# Patient Record
Sex: Female | Born: 1960 | Race: White | Hispanic: No | Marital: Married
Health system: Southern US, Community
[De-identification: ages and names within clinical notes are randomized; demographics above are authoritative.]

---

## 2001-05-04 ENCOUNTER — Other Ambulatory Visit: Admission: RE | Admit: 2001-05-04 | Discharge: 2001-05-04 | Payer: Self-pay | Admitting: Obstetrics and Gynecology

## 2002-05-05 ENCOUNTER — Other Ambulatory Visit: Admission: RE | Admit: 2002-05-05 | Discharge: 2002-05-05 | Payer: Self-pay | Admitting: Obstetrics and Gynecology

## 2003-05-09 ENCOUNTER — Other Ambulatory Visit: Admission: RE | Admit: 2003-05-09 | Discharge: 2003-05-09 | Payer: Self-pay | Admitting: Obstetrics & Gynecology

## 2012-04-01 ENCOUNTER — Encounter (HOSPITAL_COMMUNITY): Payer: Self-pay | Admitting: *Deleted

## 2012-04-01 ENCOUNTER — Emergency Department (HOSPITAL_COMMUNITY)
Admission: EM | Admit: 2012-04-01 | Discharge: 2012-04-02 | Disposition: A | Discharge status: Home / Self Care | Payer: BC Managed Care – PPO | Attending: Emergency Medicine | Admitting: Emergency Medicine

## 2012-04-01 ENCOUNTER — Emergency Department (HOSPITAL_COMMUNITY): Payer: BC Managed Care – PPO

## 2012-04-01 DIAGNOSIS — M542 Cervicalgia: Secondary | ICD-10-CM | POA: Insufficient documentation

## 2012-04-01 DIAGNOSIS — R42 Dizziness and giddiness: Secondary | ICD-10-CM | POA: Insufficient documentation

## 2012-04-01 DIAGNOSIS — R110 Nausea: Secondary | ICD-10-CM | POA: Insufficient documentation

## 2012-04-01 DIAGNOSIS — M546 Pain in thoracic spine: Secondary | ICD-10-CM | POA: Insufficient documentation

## 2012-04-01 LAB — GLUCOSE, CAPILLARY: Glucose-Capillary: 94 mg/dL (ref 70–99)

## 2012-04-01 LAB — URINALYSIS, DIPSTICK ONLY
Glucose, UA: NEGATIVE mg/dL
Hgb urine dipstick: NEGATIVE
Leukocytes, UA: NEGATIVE
Protein, ur: NEGATIVE mg/dL
pH: 8.5 — ABNORMAL HIGH (ref 5.0–8.0)

## 2012-04-01 LAB — CBC
Hemoglobin: 13.8 g/dL (ref 12.0–15.0)
Platelets: 183 10*3/uL (ref 150–400)
RBC: 4.44 MIL/uL (ref 3.87–5.11)

## 2012-04-01 LAB — DIFFERENTIAL
Basophils Relative: 0 % (ref 0–1)
Eosinophils Absolute: 0 10*3/uL (ref 0.0–0.7)
Lymphs Abs: 0.7 10*3/uL (ref 0.7–4.0)
Monocytes Relative: 6 % (ref 3–12)
Neutro Abs: 5.8 10*3/uL (ref 1.7–7.7)
Neutrophils Relative %: 84 % — ABNORMAL HIGH (ref 43–77)

## 2012-04-01 LAB — BASIC METABOLIC PANEL
BUN: 17 mg/dL (ref 6–23)
Chloride: 101 mEq/L (ref 96–112)
GFR calc Af Amer: >90 mL/min (ref >90)
GFR calc non Af Amer: >90 mL/min (ref >90)
Glucose, Bld: 103 mg/dL — ABNORMAL HIGH (ref 70–99)
Potassium: 4.3 mEq/L (ref 3.5–5.1)
Sodium: 139 mEq/L (ref 135–145)

## 2012-04-01 LAB — POCT I-STAT TROPONIN I: Troponin i, poc: 0 ng/mL (ref 0.00–0.08)

## 2012-04-01 MED ORDER — ONDANSETRON HCL 4 MG/2ML IJ SOLN
INTRAMUSCULAR | Status: AC
  Administered 2012-04-01: 20:00:00
  Filled 2012-04-01: qty 2

## 2012-04-01 NOTE — ED Notes (Signed)
Pt arrives via ambulance from urgent care center with c/o nausea/dizziness and tingling to BUE. Pt denies chest pain/SOB.

## 2012-04-01 NOTE — Discharge Instructions (Signed)
You should see a doctor soon to followup on your symptoms.  See your doctor immediately--or return to the ED--with any new or troubling symptoms including fevers, weakness, new chest pain, shortness or breath, numbness, or any other concerning symptom.

## 2012-04-01 NOTE — ED Provider Notes (Signed)
History     CSN: [id number]  Arrival date & time 04/01/12  1931   First MD Initiated Contact with Patient 04/01/12 1947      Chief Complaint  Patient presents with  . Nausea  . Dizziness    (Consider location/radiation/quality/duration/timing/severity/associated sxs/prior treatment) HPI  51 year old female with no medical history presents today with a one-hour history of lightheadedness and feelings of arm heaviness bilaterally. She first noticed them while sitting. She had the sense that her arms are heavy. She denied any other symptoms including true weakness in numbness or dysesthesia, headache, fever, chills, chest pain, dyspnea, confusion, dysarthria. Her symptoms have moderated. She presented to urgent care clinic where she was sent here. She now endorses some mild nausea with no vomiting. She called her illness mild. Nothing makes better or worse. Vital signs were normal arrival.  History reviewed. No pertinent past medical history.  History reviewed. No pertinent past surgical history.  History reviewed. No pertinent family history.  History  Substance Use Topics  . Smoking status: Not on file  . Smokeless tobacco: Not on file  . Alcohol Use: No    OB History    Grav Para Term Preterm Abortions TAB SAB Ect Mult Living                  Review of Systems Constitutional: Negative for fever and chills.  HENT: Negative for ear pain, sore throat and trouble swallowing.   Eyes: Negative for pain and visual disturbance.  Respiratory: Negative for cough and shortness of breath.   Cardiovascular: Negative for chest pain and leg swelling.  Gastrointestinal: POS dysuria, urgency and frequency.  Musculoskeletal: Negative for back pain and joint swelling.  Skin: Negative for rash and wound.  Neurological: Negative for lightheadedness, neg syncope, speech difficulty, weakness and numbness.   Allergies  Review of patient's allergies indicates no known allergies.  Home  Medications  No current outpatient prescriptions on file.  BP 130/79  Pulse 79  Temp 97.8 F (36.6 C) (Oral)  Resp 18  SpO2 99%  LMP 10/19/2008  Physical Exam Consitutional: Pt in no acute distress.   Head: Normocephalic and atraumatic.  Eyes: Extraocular motion intact, no scleral icterus Neck: Supple without meningismus, mass, or overt JVD Respiratory: Effort normal and breath sounds normal. No respiratory distress. CV: Heart regular rate and regular rhythm (sinus), no obvious murmurs.  Pulses +2 and symmetric Abdomen: Soft, non-tender, non-distended. No rebound or guarding.  MSK: Extremities are atraumatic without deformity, ROM intact Skin: Warm, dry, intact Neuro: Alert and oriented, no motor deficit noted.  PERRL.  EOMI.  Symmetric arm raises bilaterally, no pronator drift. Reflexes normal R at achilles and patella;  no clonus.  Hyperreflexive left leg at patella with 3 or 4 repeated reflexes.  Hips, knee and ankle, arm and wrist strength maintained >=4/5.  FTN and RAM normal.  CNs 2-12 normal.   Babinski normal.  Spurling negative.   Psychiatric: Mood and affect are normal  EKG:  Rate: 87  Rythym Sinus t Interval  124 ms. Axis: normal No gross conduction abnormalities appreciated.  No gross ST or T-wave abnormalities appreciated.  S wave inversions V1-V6, poor R wave progression.    ED Course  Procedures (including critical care time)  Labs Reviewed  DIFFERENTIAL - Abnormal; Notable for the following:    Neutrophils Relative 84 (*)     Lymphocytes Relative 10 (*)     All other components within normal limits  BASIC METABOLIC PANEL -  Abnormal; Notable for the following:    Glucose, Bld 103 (*)     All other components within normal limits  URINALYSIS, DIPSTICK ONLY - Abnormal; Notable for the following:    pH 8.5 (*)     Ketones, ur 15 (*)     All other components within normal limits  GLUCOSE, CAPILLARY  CBC  POCT I-STAT TROPONIN I   Dg Chest 1  View  04/01/2012  *RADIOLOGY REPORT*  Clinical Data: Nausea and dizziness.  CHEST - 1 VIEW  Comparison: None.  Findings: The lungs are well-aerated and clear.  There is no evidence of focal opacification, pleural effusion or pneumothorax.  The cardiomediastinal silhouette is within normal limits.  No acute osseous abnormalities are seen.  IMPRESSION: No acute cardiopulmonary process seen.  Original Report Authenticated By: Tonia Ghent, M.D.   Dg Cervical Spine 2-3 Views  04/01/2012  *RADIOLOGY REPORT*  Clinical Data: Posterior and left-sided neck pain, extending into the upper back.  Nausea and dizziness.  CERVICAL SPINE - 2-3 VIEW  Comparison: None.  Findings: There is no evidence of fracture or subluxation. Vertebral bodies demonstrate normal height and alignment.  There is mild multilevel disc space narrowing along the lower cervical spine.  Tiny anterior and posterior disc osteophyte complexes are noted along the cervical spine.  Prevertebral soft tissues are within normal limits.  The provided odontoid view demonstrates no significant abnormality.  The visualized lung apices are clear.  IMPRESSION: Mild degenerative change noted along the cervical spine.  No evidence of fracture or subluxation along the cervical spine.  Original Report Authenticated By: Tonia Ghent, M.D.   Dg Thoracic Spine 2 View  04/01/2012  *RADIOLOGY REPORT*  Clinical Data: Upper back pain.  Nausea and dizziness.  THORACIC SPINE - 2 VIEW  Comparison: None.  Findings: There is no evidence of fracture or subluxation. Vertebral bodies demonstrate normal height and alignment. Intervertebral disc spaces are preserved.  The visualized portions of both lungs are clear.  The mediastinum is unremarkable in appearance.  IMPRESSION: No evidence of fracture or subluxation along the thoracic spine.  Original Report Authenticated By: Tonia Ghent, M.D.     1. Sherlynn Stalls headedness       MDM  Unclear etiology. She does not describe frank  vertiginous symptoms, instead she endorses lightheadedness.  No chest pain. No known risk factors for acute coronary syndrome.  No dyspnea, or diaphoresis.  No symptoms to suggest stroke (bilateral symptoms).     No vital signs to suggest pulmonary embolus, and no risk factors for pulmonary embolus  Troponin to rule out NSTEMI.  Chest x-ray unremarkable.  Exam unremarkable with the exception of a hyperreflexia and left lower extremity.  Given her history of back pain, and perhaps fleeting arm heaviness, there is the possibility of cord impingement of the thoracic spine. However her exam is not otherwise suggestive of this ailment. She is essentially nontender to palpation of the thoracic spine. Spurling's test is illicit neck pain, but does not radiate.  We will shoot plain film to rule out bone spur or other bony pathology. Will not MR image tonight as it is not an emergent presentation.  Workup negative overall. Patient discharged home to follow up with primary care.           Larrie Kass, MD 04/02/12 [phone]

## 2012-04-01 NOTE — ED Notes (Signed)
Pt c/o continued nausea upon arrival to ED. Received 4 mg zofran enroute with minimal relief. Also received four 81mg  ASA at urgent care PTA. Pt denies c/o pain at time of arrival. States she "felt like she was going to pass out" denies LOC

## 2012-04-01 NOTE — ED Notes (Signed)
Pt states she "feels like she's having an anxiety attack" denies h/o anxiety but states "it runs in my family. It feels like exactly what my mom describes when she has an attack". Attempted therapeutic communication. Pt calms easily. Resting with family at bedside

## 2012-04-01 NOTE — ED Notes (Signed)
Urine obtained and sent.

## 2012-04-02 NOTE — ED Provider Notes (Signed)
Pt presented with lightheadedness and some paresthesia associated with arm heaviness.  Symptoms all resolved in the ED.  ACS considered, negative evaluation in the ED, no family history of ACS, no risk factors, no symptoms with exertion; overall low risk and doubt this is the etiology.  Doubt CVA but symptoms bilateral in nature.  Normal strength in sensation in the ED. At this time there does not appear to be any evidence of an acute emergency medical condition and the patient appears stable for discharge with appropriate outpatient follow up. I reviewed and interpreted the EKG during the patient's evaluation in the ED and agree with the resident's interpretation. I saw and evaluated the patient, reviewed the resident's note and I agree with the findings and plan.   Celene Kras, MD 04/02/12 [phone]

## 2012-04-02 NOTE — ED Notes (Signed)
Pt given discharge and follow up instructions without further questions after speaking with provider. Denies further needs at this time. Ambulates to lobby in NAD

## 2012-04-11 ENCOUNTER — Ambulatory Visit: Payer: BC Managed Care – PPO | Attending: Family Medicine

## 2012-04-11 DIAGNOSIS — R5381 Other malaise: Secondary | ICD-10-CM | POA: Insufficient documentation

## 2012-04-11 DIAGNOSIS — IMO0001 Reserved for inherently not codable concepts without codable children: Secondary | ICD-10-CM | POA: Insufficient documentation

## 2012-04-11 DIAGNOSIS — M25539 Pain in unspecified wrist: Secondary | ICD-10-CM | POA: Insufficient documentation

## 2012-04-11 DIAGNOSIS — M542 Cervicalgia: Secondary | ICD-10-CM | POA: Insufficient documentation

## 2012-04-15 ENCOUNTER — Other Ambulatory Visit: Payer: Self-pay | Admitting: Family Medicine

## 2012-04-15 DIAGNOSIS — R202 Paresthesia of skin: Secondary | ICD-10-CM

## 2012-04-20 ENCOUNTER — Encounter: Payer: BC Managed Care – PPO | Admitting: Physical Therapy

## 2012-04-26 ENCOUNTER — Ambulatory Visit
Admission: RE | Admit: 2012-04-26 | Discharge: 2012-04-26 | Disposition: A | Discharge status: Home / Self Care | Payer: BC Managed Care – PPO | Source: Ambulatory Visit | Attending: Family Medicine | Admitting: Family Medicine

## 2012-04-26 DIAGNOSIS — R202 Paresthesia of skin: Secondary | ICD-10-CM

## 2012-04-26 MED ORDER — GADOBENATE DIMEGLUMINE 529 MG/ML IV SOLN
10.0000 mL | Freq: Once | INTRAVENOUS | Status: AC | PRN
  Administered 2012-04-26: 10 mL via INTRAVENOUS

## 2016-02-18 DIAGNOSIS — Z79899 Other long term (current) drug therapy: Secondary | ICD-10-CM | POA: Diagnosis not present

## 2016-02-18 DIAGNOSIS — F411 Generalized anxiety disorder: Secondary | ICD-10-CM | POA: Diagnosis not present

## 2016-02-18 DIAGNOSIS — Z13220 Encounter for screening for lipoid disorders: Secondary | ICD-10-CM | POA: Diagnosis not present

## 2016-02-18 DIAGNOSIS — M8580 Other specified disorders of bone density and structure, unspecified site: Secondary | ICD-10-CM | POA: Diagnosis not present

## 2016-02-18 DIAGNOSIS — Z1159 Encounter for screening for other viral diseases: Secondary | ICD-10-CM | POA: Diagnosis not present

## 2016-03-13 DIAGNOSIS — L821 Other seborrheic keratosis: Secondary | ICD-10-CM | POA: Diagnosis not present

## 2016-03-13 DIAGNOSIS — L570 Actinic keratosis: Secondary | ICD-10-CM | POA: Diagnosis not present

## 2016-03-13 DIAGNOSIS — L812 Freckles: Secondary | ICD-10-CM | POA: Diagnosis not present

## 2016-07-15 DIAGNOSIS — Z01419 Encounter for gynecological examination (general) (routine) without abnormal findings: Secondary | ICD-10-CM | POA: Diagnosis not present

## 2016-07-15 DIAGNOSIS — Z6824 Body mass index (BMI) 24.0-24.9, adult: Secondary | ICD-10-CM | POA: Diagnosis not present

## 2016-12-11 DIAGNOSIS — Z803 Family history of malignant neoplasm of breast: Secondary | ICD-10-CM | POA: Diagnosis not present

## 2016-12-11 DIAGNOSIS — Z1231 Encounter for screening mammogram for malignant neoplasm of breast: Secondary | ICD-10-CM | POA: Diagnosis not present

## 2016-12-11 DIAGNOSIS — M8589 Other specified disorders of bone density and structure, multiple sites: Secondary | ICD-10-CM | POA: Diagnosis not present

## 2017-01-11 DIAGNOSIS — M810 Age-related osteoporosis without current pathological fracture: Secondary | ICD-10-CM | POA: Diagnosis not present

## 2017-02-18 DIAGNOSIS — M810 Age-related osteoporosis without current pathological fracture: Secondary | ICD-10-CM | POA: Diagnosis not present

## 2017-02-18 DIAGNOSIS — R030 Elevated blood-pressure reading, without diagnosis of hypertension: Secondary | ICD-10-CM | POA: Diagnosis not present

## 2017-02-18 DIAGNOSIS — K219 Gastro-esophageal reflux disease without esophagitis: Secondary | ICD-10-CM | POA: Diagnosis not present

## 2017-02-18 DIAGNOSIS — Z79899 Other long term (current) drug therapy: Secondary | ICD-10-CM | POA: Diagnosis not present

## 2017-02-18 DIAGNOSIS — Z13220 Encounter for screening for lipoid disorders: Secondary | ICD-10-CM | POA: Diagnosis not present

## 2017-07-21 DIAGNOSIS — Z6825 Body mass index (BMI) 25.0-25.9, adult: Secondary | ICD-10-CM | POA: Diagnosis not present

## 2017-07-21 DIAGNOSIS — Z01419 Encounter for gynecological examination (general) (routine) without abnormal findings: Secondary | ICD-10-CM | POA: Diagnosis not present

## 2018-03-08 DIAGNOSIS — Z79899 Other long term (current) drug therapy: Secondary | ICD-10-CM | POA: Diagnosis not present

## 2018-03-08 DIAGNOSIS — K219 Gastro-esophageal reflux disease without esophagitis: Secondary | ICD-10-CM | POA: Diagnosis not present

## 2018-03-08 DIAGNOSIS — Z13220 Encounter for screening for lipoid disorders: Secondary | ICD-10-CM | POA: Diagnosis not present

## 2018-03-08 DIAGNOSIS — M810 Age-related osteoporosis without current pathological fracture: Secondary | ICD-10-CM | POA: Diagnosis not present

## 2018-07-25 DIAGNOSIS — Z1151 Encounter for screening for human papillomavirus (HPV): Secondary | ICD-10-CM | POA: Diagnosis not present

## 2018-07-25 DIAGNOSIS — Z6826 Body mass index (BMI) 26.0-26.9, adult: Secondary | ICD-10-CM | POA: Diagnosis not present

## 2018-07-25 DIAGNOSIS — Z01419 Encounter for gynecological examination (general) (routine) without abnormal findings: Secondary | ICD-10-CM | POA: Diagnosis not present

## 2018-12-14 DIAGNOSIS — M810 Age-related osteoporosis without current pathological fracture: Secondary | ICD-10-CM | POA: Diagnosis not present

## 2018-12-14 DIAGNOSIS — Z1231 Encounter for screening mammogram for malignant neoplasm of breast: Secondary | ICD-10-CM | POA: Diagnosis not present

## 2018-12-14 DIAGNOSIS — Z8262 Family history of osteoporosis: Secondary | ICD-10-CM | POA: Diagnosis not present

## 2018-12-14 DIAGNOSIS — M8589 Other specified disorders of bone density and structure, multiple sites: Secondary | ICD-10-CM | POA: Diagnosis not present

## 2019-03-09 DIAGNOSIS — K219 Gastro-esophageal reflux disease without esophagitis: Secondary | ICD-10-CM | POA: Diagnosis not present

## 2019-03-09 DIAGNOSIS — M542 Cervicalgia: Secondary | ICD-10-CM | POA: Diagnosis not present

## 2019-03-09 DIAGNOSIS — M810 Age-related osteoporosis without current pathological fracture: Secondary | ICD-10-CM | POA: Diagnosis not present

## 2019-03-09 DIAGNOSIS — E7800 Pure hypercholesterolemia, unspecified: Secondary | ICD-10-CM | POA: Diagnosis not present

## 2019-03-09 DIAGNOSIS — Z79899 Other long term (current) drug therapy: Secondary | ICD-10-CM | POA: Diagnosis not present

## 2019-03-09 DIAGNOSIS — F411 Generalized anxiety disorder: Secondary | ICD-10-CM | POA: Diagnosis not present

## 2019-03-09 DIAGNOSIS — D700 Congenital agranulocytosis: Secondary | ICD-10-CM | POA: Diagnosis not present

## 2019-07-27 DIAGNOSIS — Z1151 Encounter for screening for human papillomavirus (HPV): Secondary | ICD-10-CM | POA: Diagnosis not present

## 2019-07-27 DIAGNOSIS — Z6827 Body mass index (BMI) 27.0-27.9, adult: Secondary | ICD-10-CM | POA: Diagnosis not present

## 2019-07-27 DIAGNOSIS — Z124 Encounter for screening for malignant neoplasm of cervix: Secondary | ICD-10-CM | POA: Diagnosis not present

## 2019-07-27 DIAGNOSIS — Z01419 Encounter for gynecological examination (general) (routine) without abnormal findings: Secondary | ICD-10-CM | POA: Diagnosis not present

## 2019-12-20 DIAGNOSIS — Z1231 Encounter for screening mammogram for malignant neoplasm of breast: Secondary | ICD-10-CM | POA: Diagnosis not present

## 2020-01-25 DIAGNOSIS — M170 Bilateral primary osteoarthritis of knee: Secondary | ICD-10-CM | POA: Diagnosis not present

## 2020-02-10 ENCOUNTER — Ambulatory Visit: Payer: Self-pay | Attending: Internal Medicine

## 2020-02-10 DIAGNOSIS — Z23 Encounter for immunization: Secondary | ICD-10-CM

## 2020-02-10 NOTE — Progress Notes (Signed)
   Covid-19 Vaccination Clinic  Name:  DYMON SUMMERHILL    MRN: [id number] DOB: 01-15-1961  02/10/2020  Ms. Gilcrest was observed post Covid-19 immunization for 15 minutes without incident. She was provided with Vaccine Information Sheet and instruction to access the V-Safe system.   Ms. Sachs was instructed to call 911 with any severe reactions post vaccine: Marland Kitchen Difficulty breathing  . Swelling of face and throat  . A fast heartbeat  . A bad rash all over body  . Dizziness and weakness   Immunizations Administered    Name Date Dose VIS Date Route   Pfizer COVID-19 Vaccine 02/10/2020 10:02 AM 0.3 mL 12/13/2018 Intramuscular   Manufacturer: [employer], [employer]   Lot: [id number]   NDC: [address]-4

## 2020-03-04 ENCOUNTER — Ambulatory Visit: Payer: Self-pay | Attending: Internal Medicine

## 2020-03-04 DIAGNOSIS — Z23 Encounter for immunization: Secondary | ICD-10-CM

## 2020-03-04 NOTE — Progress Notes (Signed)
   Covid-19 Vaccination Clinic  Name:  Jill Allison    MRN: [id number] DOB: 1961/03/05  03/04/2020  [name] was observed post Covid-19 immunization for 15 minutes without incident. She was provided with Vaccine Information Sheet and instruction to access the V-Safe system.   [name] was instructed to call 911 with any severe reactions post vaccine: Marland Kitchen Difficulty breathing  . Swelling of face and throat  . A fast heartbeat  . A bad rash all over body  . Dizziness and weakness   Immunizations Administered    Name Date Dose VIS Date Route   Pfizer COVID-19 Vaccine 03/04/2020  8:25 AM 0.3 mL 12/13/2018 Intramuscular   Manufacturer: [employer], [employer]   Lot: [id number]   NDC: [address]-6

## 2020-03-25 DIAGNOSIS — Z0000 Encounter for general adult medical examination without abnormal findings: Secondary | ICD-10-CM | POA: Diagnosis not present

## 2020-03-25 DIAGNOSIS — Z79899 Other long term (current) drug therapy: Secondary | ICD-10-CM | POA: Diagnosis not present

## 2020-03-25 DIAGNOSIS — E559 Vitamin D deficiency, unspecified: Secondary | ICD-10-CM | POA: Diagnosis not present

## 2020-03-25 DIAGNOSIS — Z23 Encounter for immunization: Secondary | ICD-10-CM | POA: Diagnosis not present

## 2020-03-25 DIAGNOSIS — E7800 Pure hypercholesterolemia, unspecified: Secondary | ICD-10-CM | POA: Diagnosis not present

## 2020-06-28 DIAGNOSIS — Z23 Encounter for immunization: Secondary | ICD-10-CM | POA: Diagnosis not present

## 2020-06-28 DIAGNOSIS — M7122 Synovial cyst of popliteal space [Baker], left knee: Secondary | ICD-10-CM | POA: Diagnosis not present

## 2020-07-13 DIAGNOSIS — Z20822 Contact with and (suspected) exposure to covid-19: Secondary | ICD-10-CM | POA: Diagnosis not present

## 2020-07-29 DIAGNOSIS — Z01419 Encounter for gynecological examination (general) (routine) without abnormal findings: Secondary | ICD-10-CM | POA: Diagnosis not present

## 2020-07-29 DIAGNOSIS — Z1151 Encounter for screening for human papillomavirus (HPV): Secondary | ICD-10-CM | POA: Diagnosis not present

## 2020-07-29 DIAGNOSIS — Z6827 Body mass index (BMI) 27.0-27.9, adult: Secondary | ICD-10-CM | POA: Diagnosis not present

## 2020-12-25 DIAGNOSIS — Z1231 Encounter for screening mammogram for malignant neoplasm of breast: Secondary | ICD-10-CM | POA: Diagnosis not present

## 2020-12-30 DIAGNOSIS — Z780 Asymptomatic menopausal state: Secondary | ICD-10-CM | POA: Diagnosis not present

## 2020-12-30 DIAGNOSIS — M8589 Other specified disorders of bone density and structure, multiple sites: Secondary | ICD-10-CM | POA: Diagnosis not present

## 2021-05-05 DIAGNOSIS — M25551 Pain in right hip: Secondary | ICD-10-CM | POA: Diagnosis not present

## 2021-05-19 DIAGNOSIS — Z79899 Other long term (current) drug therapy: Secondary | ICD-10-CM | POA: Diagnosis not present

## 2021-05-19 DIAGNOSIS — Z0000 Encounter for general adult medical examination without abnormal findings: Secondary | ICD-10-CM | POA: Diagnosis not present

## 2021-05-19 DIAGNOSIS — Z23 Encounter for immunization: Secondary | ICD-10-CM | POA: Diagnosis not present

## 2021-05-19 DIAGNOSIS — E559 Vitamin D deficiency, unspecified: Secondary | ICD-10-CM | POA: Diagnosis not present

## 2021-05-19 DIAGNOSIS — E7800 Pure hypercholesterolemia, unspecified: Secondary | ICD-10-CM | POA: Diagnosis not present

## 2021-05-20 ENCOUNTER — Other Ambulatory Visit: Payer: Self-pay | Admitting: Family Medicine

## 2021-05-20 ENCOUNTER — Ambulatory Visit
Admission: RE | Admit: 2021-05-20 | Discharge: 2021-05-20 | Disposition: A | Discharge status: Home / Self Care | Payer: Self-pay | Source: Ambulatory Visit | Attending: Family Medicine | Admitting: Family Medicine

## 2021-05-20 ENCOUNTER — Other Ambulatory Visit: Payer: Self-pay

## 2021-05-20 DIAGNOSIS — R1030 Lower abdominal pain, unspecified: Secondary | ICD-10-CM

## 2021-05-20 DIAGNOSIS — M25559 Pain in unspecified hip: Secondary | ICD-10-CM

## 2021-05-20 DIAGNOSIS — M25551 Pain in right hip: Secondary | ICD-10-CM | POA: Diagnosis not present

## 2021-08-04 DIAGNOSIS — Z113 Encounter for screening for infections with a predominantly sexual mode of transmission: Secondary | ICD-10-CM | POA: Diagnosis not present

## 2021-08-04 DIAGNOSIS — Z124 Encounter for screening for malignant neoplasm of cervix: Secondary | ICD-10-CM | POA: Diagnosis not present

## 2021-08-04 DIAGNOSIS — R87610 Atypical squamous cells of undetermined significance on cytologic smear of cervix (ASC-US): Secondary | ICD-10-CM | POA: Diagnosis not present

## 2021-08-04 DIAGNOSIS — Z6827 Body mass index (BMI) 27.0-27.9, adult: Secondary | ICD-10-CM | POA: Diagnosis not present

## 2021-08-04 DIAGNOSIS — Z01419 Encounter for gynecological examination (general) (routine) without abnormal findings: Secondary | ICD-10-CM | POA: Diagnosis not present

## 2021-08-19 DIAGNOSIS — R87612 Low grade squamous intraepithelial lesion on cytologic smear of cervix (LGSIL): Secondary | ICD-10-CM | POA: Diagnosis not present

## 2021-12-31 DIAGNOSIS — Z1231 Encounter for screening mammogram for malignant neoplasm of breast: Secondary | ICD-10-CM | POA: Diagnosis not present

## 2022-01-19 DIAGNOSIS — M25571 Pain in right ankle and joints of right foot: Secondary | ICD-10-CM | POA: Diagnosis not present

## 2022-01-19 DIAGNOSIS — M5441 Lumbago with sciatica, right side: Secondary | ICD-10-CM | POA: Diagnosis not present

## 2022-02-07 DIAGNOSIS — R1031 Right lower quadrant pain: Secondary | ICD-10-CM | POA: Diagnosis not present

## 2022-02-17 DIAGNOSIS — R030 Elevated blood-pressure reading, without diagnosis of hypertension: Secondary | ICD-10-CM | POA: Diagnosis not present

## 2022-02-17 DIAGNOSIS — M5431 Sciatica, right side: Secondary | ICD-10-CM | POA: Diagnosis not present

## 2022-03-09 ENCOUNTER — Ambulatory Visit: Payer: BC Managed Care – PPO

## 2022-03-10 NOTE — Therapy (Unsigned)
OUTPATIENT PHYSICAL THERAPY THORACOLUMBAR EVALUATION   Patient Name: Jill Allison MRN: [id number] DOB:1961/03/21, 61 y.o., female Today's Date: 03/11/2022   PT End of Session - 03/11/22 0750     Visit Number 1    Number of Visits 12    Date for PT Re-Evaluation 04/22/22    Authorization Type [employer]    PT Start Time 0800    PT Stop Time 0842    PT Time Calculation (min) 42 min    Activity Tolerance Patient tolerated treatment well    Behavior During Therapy [facility] for tasks assessed/performed             No past medical history on file. No past surgical history on file. There are no problems to display for this patient.   PCP: Jonathon Jordan, MD   REFERRING PROVIDER: Jonathon Jordan,  REFERRING DIAG:  MD M54.31 (ICD-10-CM) - Sciatica, right side   Rationale for Evaluation and Treatment Rehabilitation  THERAPY DIAG:  Pain in right hip  Muscle weakness (generalized)  Other abnormalities of gait and mobility  ONSET DATE: 6 weeks (acute on chronic)   SUBJECTIVE:                                                                                                                                                                                           SUBJECTIVE STATEMENT: Pt had hip pain last year and it went away eventually.  Pain returned and MD stated it was likely related to her back.  She complains of pain in Rt leg/back, extends to knee and entire thigh.  Some days she can barely walk, lead with Rt LE with stairs.  Prednisone, gabapentin have only helped minimally.  She denies clicking in hip, sensory issues.    PERTINENT HISTORY:  Osteoporosis, GAD, reflux, knee OA  PAIN:  Are you having pain? Yes: NPRS scale: 8/10 Pain location: Rt groin to knee  Pain description: achy, sore Aggravating factors: putting weight on it, over-doing it Relieving factors: none with laying down, sitting, gentle walking Was 8/10 this AM.  PRECAUTIONS: Other: osteopenia, avoid  flex/rot combo  WEIGHT BEARING RESTRICTIONS No  FALLS:  Has patient fallen in last 6 months? 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   PLOF: Independent and Leisure: puzzles, reading, travel  PATIENT GOALS : Pt would like to have less pain , has a trip next month, walk   OBJECTIVE:   DIAGNOSTIC FINDINGS:  XR hip and lumbar normal   PATIENT SURVEYS:  FOTO 49%  SCREENING FOR RED FLAGS: Bowel or bladder incontinence: No Spinal tumors: No Cauda equina syndrome: No Compression fracture: No Abdominal aneurysm: No  COGNITION:  Overall cognitive status: Within functional limits for tasks assessed     SENSATION: WFL  MUSCLE LENGTH: Hamstrings: Right 90 deg; Left 90 deg   POSTURE: rounded shoulders, forward head, increased thoracic kyphosis, and pelvis shifted to L and trunk shifts Rt with walking   PALPATION: Pain reproduced along Rt glute medius and glute minimus.  Tender in piriformis  LUMBAR ROM:   Active  A/PROM  eval  Flexion WNL  Extension WNL central low back pain   Right lateral flexion WNL, Stretch on L   Left lateral flexion WNL, Stretch on R   Right rotation WNL  Left rotation WNL   (Blank rows = not tested)  LOWER EXTREMITY ROM:     Active  Right eval Left eval  Hip flexion WNL WNL   Hip extension    Hip abduction    Hip adduction    Hip internal rotation WNL WNL  Hip external rotation WNL WNL  Knee flexion    Knee extension    Ankle dorsiflexion    Ankle plantarflexion    Ankle inversion    Ankle eversion     (Blank rows = not tested) 5 LOWER EXTREMITY MMT:    MMT Right eval Left eval  Hip flexion 5/5  5/5  Hip extension 4/5  4/5  Hip abduction Pain 4-/5 4+/5  Hip adduction    Hip internal rotation    Hip external  rotation    Knee flexion 5/5 5/5  Knee extension 5/5 5/5  Ankle dorsiflexion 5/5 5/5  Ankle plantarflexion    Ankle inversion    Ankle eversion     (Blank rows = not tested)  LUMBAR SPECIAL TESTS:  Straight leg raise test: Negative, Single leg stance test: NT, FABER test: neg, neg FADIR, Trendelenburg sign: POS , and Thomas test: NEG   FUNCTIONAL TESTS:  5 times sit to stand: 11 sec  30 seconds chair stand test NT   GAIT: Distance walked: 150 Assistive device utilized: None Level of assistance: Complete Independence Comments: significant trendelenburg with gait trunk leans Rt with Rt LE weightbearing     TODAY'S TREATMENT  PT eval, HEP, self care: glute med, anatomy (tendinopathy vs sciatica) Dry needling, tennis ball for release     PATIENT EDUCATION:  Education details: HEP, see above  Person educated: Patient Education method: Explanation, Demonstration, Tactile cues, Verbal cues, and Handouts Education comprehension: verbalized understanding and returned demonstration   HOME EXERCISE PROGRAM: Access Code: [email or web address] URL: [email or web address] Date: 03/11/2022 Prepared by: Raeford Razor  Exercises - Supine Figure 4 Piriformis Stretch  - 2 x daily - 7 x weekly - 1 sets - 5 reps - 30 hold - Supine Figure 4 Piriformis Stretch  - 2 x daily - 7 x weekly - 1 sets - 5 reps - 30 hold - Supine Bridge with Resistance Band  - 2 x daily - 7 x weekly - 2 sets - 10 reps - 5 hold - Clamshell with Resistance  - 2 x daily - 7 x weekly - 2 sets - 10 reps - 5 hold - Figure 4 Gluteus Mobilization on Foam Roll  - 1 x daily - 7 x weekly - 1 sets - 10 reps - 30 hold  ASSESSMENT:  CLINICAL IMPRESSION: Patient is a  61 y.o. female who was seen today for physical therapy evaluation and treatment for Rt sided back/hip/leg pain with symptoms consistent with glute med tendinopathy.     OBJECTIVE IMPAIRMENTS Abnormal gait, decreased mobility, difficulty walking, decreased  strength, increased fascial restrictions, postural dysfunction, and pain.   ACTIVITY LIMITATIONS standing, squatting, stairs, and locomotion level  PARTICIPATION LIMITATIONS: meal prep, cleaning, laundry, interpersonal relationship, shopping, and community activity  PERSONAL FACTORS Profession, Time since onset of injury/illness/exacerbation, and 1 comorbidity: osteopenia  are also affecting patient's functional outcome.   REHAB POTENTIAL: Excellent  CLINICAL DECISION MAKING: evolving   EVALUATION COMPLEXITY: Mod   GOALS: Goals reviewed with patient? Yes  SHORT TERM GOALS: Target date: 03/25/2022  Pt will be I with initial HEP  Baseline: given on eval  Goal status: INITIAL  2.  Pt will take more standing, stretch breaks during work day to improve hip mobility  Baseline:  quite sedentary Goal status: INITIAL   LONG TERM GOALS: Target date: 04/22/2022  Pt will be I with final HEP upon DC Baseline:  Goal status: INITIAL  2.  FOTO score will improve to 65% or better upon discharge  Baseline: 49% Goal status: INITIAL  3.  Pt will be able to ambulate 300 feet with min noticeable trunk lean at self selected speed.  Baseline: sig.  Trunk lean  Goal status: INITIAL  4.  Pt will be able to lead with Rt foot when walking up steps of her home with 1 rail, pain < 2/10 in Rt hip.  Baseline: unable  Goal status: INITIAL  5.  Pt will be able to stand on Rt LE with level pelvis for 10 sec to demo improved glute med activation Baseline: unable  Goal status: INITIAL   PLAN: PT FREQUENCY: 2x/week  PT DURATION: 6 weeks  PLANNED INTERVENTIONS: Therapeutic exercises, Therapeutic activity, Neuromuscular re-education, Balance training, Gait training, Patient/Family education, Joint mobilization, Aquatic Therapy, Dry Needling, Electrical stimulation, Cryotherapy, Traction, Ionotophoresis 4mg /ml Dexamethasone, Manual therapy, and Re-evaluation.  PLAN FOR NEXT SESSION: check HEP , DN to  glute med, manual    Chanie Soucek, PT 03/11/2022, 8:44 AM

## 2022-03-11 ENCOUNTER — Ambulatory Visit: Payer: BC Managed Care – PPO | Attending: Family Medicine | Admitting: Physical Therapy

## 2022-03-11 DIAGNOSIS — M25551 Pain in right hip: Secondary | ICD-10-CM | POA: Insufficient documentation

## 2022-03-11 DIAGNOSIS — R2689 Other abnormalities of gait and mobility: Secondary | ICD-10-CM | POA: Insufficient documentation

## 2022-03-11 DIAGNOSIS — M6281 Muscle weakness (generalized): Secondary | ICD-10-CM | POA: Diagnosis not present

## 2022-03-13 NOTE — Therapy (Unsigned)
OUTPATIENT PHYSICAL THERAPY TREATMENT NOTE   Patient Name: Jill Allison MRN: [id number] DOB:03/13/1961, 61 y.o., female Today's Date: 03/17/2022  PCP: Dr. Mila Palmer  REFERRING PROVIDER: Dr. Mila Palmer  END OF SESSION:   PT End of Session - 03/17/22 1158     Visit Number 2    Number of Visits 12    Date for PT Re-Evaluation 04/22/22    Authorization Type BCBS    PT Start Time 1148    PT Stop Time 1230    PT Time Calculation (min) 42 min    Activity Tolerance Patient tolerated treatment well    Behavior During Therapy [facility] for tasks assessed/performed             No past medical history on file. No past surgical history on file. There are no problems to display for this patient.   REFERRING DIAG: Sciatica  THERAPY DIAG:  Pain in right hip  Muscle weakness (generalized)  Other abnormalities of gait and mobility  Rationale for Evaluation and Treatment Rehabilitation  PERTINENT HISTORY: see snapshot  PRECAUTIONS: none  SUBJECTIVE: It hurts when I walk.  Not as bad as before.    PAIN:  Are you having pain? Yes: NPRS scale: 6/10 Pain location: Rt hip and thigh Pain description: sore Aggravating factors: standing, walking  Relieving factors: sitting    OBJECTIVE: (objective measures completed at initial evaluation unless otherwise dated)  DIAGNOSTIC FINDINGS:  XR hip and lumbar normal    PATIENT SURVEYS:  FOTO 49%   SCREENING FOR RED FLAGS: Bowel or bladder incontinence: No Spinal tumors: No Cauda equina syndrome: No Compression fracture: No Abdominal aneurysm: No   COGNITION:           Overall cognitive status: Within functional limits for tasks assessed                          SENSATION: WFL   MUSCLE LENGTH: Hamstrings: Right 90 deg; Left 90 deg     POSTURE: rounded shoulders, forward head, increased thoracic kyphosis, and pelvis shifted to L and trunk shifts Rt with walking    PALPATION: Pain reproduced along Rt glute  medius and glute minimus.  Tender in piriformis   LUMBAR ROM:    Active  A/PROM  eval  Flexion WNL  Extension WNL central low back pain   Right lateral flexion WNL, Stretch on L   Left lateral flexion WNL, Stretch on R   Right rotation WNL  Left rotation WNL   (Blank rows = not tested)   LOWER EXTREMITY ROM:      Active  Right eval Left eval  Hip flexion WNL WNL   Hip extension      Hip abduction      Hip adduction      Hip internal rotation WNL WNL  Hip external rotation WNL WNL  Knee flexion      Knee extension      Ankle dorsiflexion      Ankle plantarflexion      Ankle inversion      Ankle eversion       (Blank rows = not tested) 5 LOWER EXTREMITY MMT:     MMT Right eval Left eval  Hip flexion 5/5  5/5  Hip extension 4/5  4/5  Hip abduction Pain 4-/5 4+/5  Hip adduction      Hip internal rotation      Hip external rotation  Knee flexion 5/5 5/5  Knee extension 5/5 5/5  Ankle dorsiflexion 5/5 5/5  Ankle plantarflexion      Ankle inversion      Ankle eversion       (Blank rows = not tested)   LUMBAR SPECIAL TESTS:  Straight leg raise test: Negative, Single leg stance test: NT, FABER test: neg, neg FADIR, Trendelenburg sign: POS , and Thomas test: NEG    FUNCTIONAL TESTS:  5 times sit to stand: 11 sec  30 seconds chair stand test NT    GAIT: Distance walked: 150 Assistive device utilized: None Level of assistance: Complete Independence Comments: significant trendelenburg with gait trunk leans Rt with Rt LE weightbearing        TODAY'S TREATMENT  PT eval, HEP, self care: glute med, anatomy (tendinopathy vs sciatica) Dry needling, tennis ball for release       PATIENT EDUCATION:  Education details: HEP, see above  Person educated: Patient Education method: Explanation, Demonstration, Tactile cues, Verbal cues, and Handouts Education comprehension: verbalized understanding and returned demonstration     HOME EXERCISE PROGRAM: Access  Code: [email or web address] URL: [email or web address] Date: 03/11/2022 Prepared by: Karie MainlandJennifer Phat Dalton   Exercises - Supine Figure 4 Piriformis Stretch  - 2 x daily - 7 x weekly - 1 sets - 5 reps - 30 hold - Supine Figure 4 Piriformis Stretch  - 2 x daily - 7 x weekly - 1 sets - 5 reps - 30 hold - Supine Bridge with Resistance Band  - 2 x daily - 7 x weekly - 2 sets - 10 reps - 5 hold - Clamshell with Resistance  - 2 x daily - 7 x weekly - 2 sets - 10 reps - 5 hold - Figure 4 Gluteus Mobilization on Foam Roll  - 1 x daily - 7 x weekly - 1 sets - 10 reps - 30 hold   ASSESSMENT:   CLINICAL IMPRESSION: Patient complains of Rt sided leg pain with gait.  Pain is in the L2-L3 nerve distribution (adductors, thigh) but is also in Rt lateral hip/glute.  Unclear if her issue is from the lumbar spine or the hip. Hip scour and FABER/FADER all normal. Will continue to improve core and hip strength regardless.  Manual to rt upper lumbar was painful with trigger points, no referral to ant. Hip.    OBJECTIVE IMPAIRMENTS Abnormal gait, decreased mobility, difficulty walking, decreased strength, increased fascial restrictions, postural dysfunction, and pain.    ACTIVITY LIMITATIONS standing, squatting, stairs, and locomotion level   PARTICIPATION LIMITATIONS: meal prep, cleaning, laundry, interpersonal relationship, shopping, and community activity   PERSONAL FACTORS Profession, Time since onset of injury/illness/exacerbation, and 1 comorbidity: osteopenia  are also affecting patient's functional outcome.    REHAB POTENTIAL: Excellent   CLINICAL DECISION MAKING: evolving    EVALUATION COMPLEXITY: Mod     GOALS: Goals reviewed with patient? Yes   SHORT TERM GOALS: Target date: 03/25/2022   Pt will be I with initial HEP  Baseline: given on eval  Goal status: INITIAL   2.  Pt will take more standing, stretch breaks during work day to improve hip mobility  Baseline:  quite sedentary Goal status:  INITIAL     LONG TERM GOALS: Target date: 04/22/2022   Pt will be I with final HEP upon DC Baseline:  Goal status: INITIAL   2.  FOTO score will improve to 65% or better upon discharge  Baseline: 49% Goal status: INITIAL   3.  Pt will be  able to ambulate 300 feet with min noticeable trunk lean at self selected speed.  Baseline: sig.  Trunk lean  Goal status: INITIAL   4.  Pt will be able to lead with Rt foot when walking up steps of her home with 1 rail, pain < 2/10 in Rt hip.  Baseline: unable  Goal status: INITIAL   5.  Pt will be able to stand on Rt LE with level pelvis for 10 sec to demo improved glute med activation Baseline: unable  Goal status: INITIAL     PLAN: PT FREQUENCY: 2x/week   PT DURATION: 6 weeks   PLANNED INTERVENTIONS: Therapeutic exercises, Therapeutic activity, Neuromuscular re-education, Balance training, Gait training, Patient/Family education, Joint mobilization, Aquatic Therapy, Dry Needling, Electrical stimulation, Cryotherapy, Traction, Ionotophoresis 4mg /ml Dexamethasone, Manual therapy, and Re-evaluation.   PLAN FOR NEXT SESSION: core, hip. Prone/quadruped  hip and core? Needs lateral hip strength (step ups, lateral walking ) when able- right now this is too painful.        , PT 03/17/22 1:19 PM Phone: [phone] Fax: [phone]   Hoang Pettingill, PT 03/17/2022, 1:13 PM

## 2022-03-17 ENCOUNTER — Ambulatory Visit: Payer: BC Managed Care – PPO | Admitting: Physical Therapy

## 2022-03-17 DIAGNOSIS — R2689 Other abnormalities of gait and mobility: Secondary | ICD-10-CM | POA: Diagnosis not present

## 2022-03-17 DIAGNOSIS — M25551 Pain in right hip: Secondary | ICD-10-CM | POA: Diagnosis not present

## 2022-03-17 DIAGNOSIS — M6281 Muscle weakness (generalized): Secondary | ICD-10-CM | POA: Diagnosis not present

## 2022-03-19 ENCOUNTER — Ambulatory Visit: Payer: BC Managed Care – PPO | Attending: Family Medicine

## 2022-03-19 DIAGNOSIS — M6281 Muscle weakness (generalized): Secondary | ICD-10-CM | POA: Insufficient documentation

## 2022-03-19 DIAGNOSIS — M25551 Pain in right hip: Secondary | ICD-10-CM | POA: Diagnosis not present

## 2022-03-19 DIAGNOSIS — R2689 Other abnormalities of gait and mobility: Secondary | ICD-10-CM | POA: Insufficient documentation

## 2022-03-19 NOTE — Therapy (Addendum)
OUTPATIENT PHYSICAL THERAPY TREATMENT NOTE DISCHARGE   Patient Name: Jill Allison MRN: [id number] DOB:1960/10/27, 61 y.o., female Today's Date: 03/19/2022  PCP: Dr. Jonathon Jordan  REFERRING PROVIDER: Dr. Jonathon Jordan  END OF SESSION:   PT End of Session - 03/19/22 1209     Visit Number 3    Number of Visits 12    Date for PT Re-Evaluation 04/22/22    Authorization Type [employer]BS              History reviewed. No pertinent past medical history. History reviewed. No pertinent surgical history. There are no problems to display for this patient.   REFERRING DIAG: Sciatica  THERAPY DIAG:  Pain in right hip  Muscle weakness (generalized)  Other abnormalities of gait and mobility  Rationale for Evaluation and Treatment Rehabilitation  PERTINENT HISTORY: see snapshot  PRECAUTIONS: none  SUBJECTIVE: I don't have any pain when I'm sitting or sleeping, but I do when I'm walking.  PAIN:  Are you having pain? Yes: NPRS scale: 6/10 Pain location: Rt hip and thigh Pain description: sore Aggravating factors: standing, walking  Relieving factors: sitting    OBJECTIVE: (objective measures completed at initial evaluation unless otherwise dated)  DIAGNOSTIC FINDINGS:  XR hip and lumbar normal    PATIENT SURVEYS:  FOTO 49%   SCREENING FOR RED FLAGS: Bowel or bladder incontinence: No Spinal tumors: No Cauda equina syndrome: No Compression fracture: No Abdominal aneurysm: No   COGNITION:           Overall cognitive status: Within functional limits for tasks assessed                          SENSATION: WFL   MUSCLE LENGTH: Hamstrings: Right 90 deg; Left 90 deg     POSTURE: rounded shoulders, forward head, increased thoracic kyphosis, and pelvis shifted to L and trunk shifts Rt with walking    PALPATION: Pain reproduced along Rt glute medius and glute minimus.  Tender in piriformis   LUMBAR ROM:    Active  A/PROM  eval  Flexion WNL  Extension WNL  central low back pain   Right lateral flexion WNL, Stretch on L   Left lateral flexion WNL, Stretch on R   Right rotation WNL  Left rotation WNL   (Blank rows = not tested)   LOWER EXTREMITY ROM:      Active  Right eval Left eval  Hip flexion WNL WNL   Hip extension      Hip abduction      Hip adduction      Hip internal rotation WNL WNL  Hip external rotation WNL WNL  Knee flexion      Knee extension      Ankle dorsiflexion      Ankle plantarflexion      Ankle inversion      Ankle eversion       (Blank rows = not tested) 5 LOWER EXTREMITY MMT:     MMT Right eval Left eval  Hip flexion 5/5  5/5  Hip extension 4/5  4/5  Hip abduction Pain 4-/5 4+/5  Hip adduction      Hip internal rotation      Hip external rotation      Knee flexion 5/5 5/5  Knee extension 5/5 5/5  Ankle dorsiflexion 5/5 5/5  Ankle plantarflexion      Ankle inversion      Ankle eversion       (  Blank rows = not tested)   LUMBAR SPECIAL TESTS:  Straight leg raise test: Negative, Single leg stance test: NT, FABER test: neg, neg FADIR, Trendelenburg sign: POS , and Thomas test: NEG    FUNCTIONAL TESTS:  5 times sit to stand: 11 sec  30 seconds chair stand test NT    GAIT: Distance walked: 150 Assistive device utilized: None Level of assistance: Complete Independence Comments: significant trendelenburg with gait trunk leans Rt with Rt LE weightbearing        TODAY'S TREATMENT  [facility] Adult PT Treatment:                                                DATE: 03/19/2022 Therapeutic Exercise: Nustep level 5 x 5 mins Supine figure 4 piriformis stretch 2x30" BIL Supine butterfly stretch x30" Supine bridge with GTB around knees 2x10 Supine bridge with ball squeeze 5" hold 2x10 Supine pilates ring squeeze 5" hold 2x10 Supine clamshell GTB 2x10 Supine LTR x10 BIL Sidelying hip abduction 2x10 Dead bugs 2x5 BIL Cat/cow x20 Quadruped alternating UE reach x10 BIL Quadruped alternating LE reach x10  BIL Bird dogs 2x10 BIL Quadruped hip abduction with ER 2x10    PT eval, HEP, self care: glute med, anatomy (tendinopathy vs sciatica) Dry needling, tennis ball for release       PATIENT EDUCATION:  Education details: HEP, see above  Person educated: Patient Education method: Explanation, Demonstration, Tactile cues, Verbal cues, and Handouts Education comprehension: verbalized understanding and returned demonstration     HOME EXERCISE PROGRAM: Access Code: [email or web address] URL: [email or web address] Date: 03/11/2022 Prepared by: Raeford Razor   Exercises - Supine Figure 4 Piriformis Stretch  - 2 x daily - 7 x weekly - 1 sets - 5 reps - 30 hold - Supine Figure 4 Piriformis Stretch  - 2 x daily - 7 x weekly - 1 sets - 5 reps - 30 hold - Supine Bridge with Resistance Band  - 2 x daily - 7 x weekly - 2 sets - 10 reps - 5 hold - Clamshell with Resistance  - 2 x daily - 7 x weekly - 2 sets - 10 reps - 5 hold - Figure 4 Gluteus Mobilization on Foam Roll  - 1 x daily - 7 x weekly - 1 sets - 10 reps - 30 hold   ASSESSMENT:   CLINICAL IMPRESSION: Patient presents to PT with moderate pain in her R anterior and medial thigh that is worse with walking and stairs. Session today focused on core and proximal hip strengthening. She reports slight pain with exercises when weight bearing on the RLE, whether standing or in quadruped. Patient was able to tolerate all prescribed exercises with no adverse effects. Patient continues to benefit from skilled PT services and should be progressed as able to improve functional independence.     OBJECTIVE IMPAIRMENTS Abnormal gait, decreased mobility, difficulty walking, decreased strength, increased fascial restrictions, postural dysfunction, and pain.    ACTIVITY LIMITATIONS standing, squatting, stairs, and locomotion level   PARTICIPATION LIMITATIONS: meal prep, cleaning, laundry, interpersonal relationship, shopping, and community activity    PERSONAL FACTORS Profession, Time since onset of injury/illness/exacerbation, and 1 comorbidity: osteopenia  are also affecting patient's functional outcome.    REHAB POTENTIAL: Excellent   CLINICAL DECISION MAKING: evolving    EVALUATION COMPLEXITY: Mod     GOALS: Goals reviewed  with patient? Yes   SHORT TERM GOALS: Target date: 03/25/2022   Pt will be I with initial HEP  Baseline: given on eval  Goal status: INITIAL   2.  Pt will take more standing, stretch breaks during work day to improve hip mobility  Baseline:  quite sedentary Goal status: INITIAL     LONG TERM GOALS: Target date: 04/22/2022   Pt will be I with final HEP upon DC Baseline:  Goal status: INITIAL   2.  FOTO score will improve to 65% or better upon discharge  Baseline: 49% Goal status: INITIAL   3.  Pt will be able to ambulate 300 feet with min noticeable trunk lean at self selected speed.  Baseline: sig.  Trunk lean  Goal status: INITIAL   4.  Pt will be able to lead with Rt foot when walking up steps of her home with 1 rail, pain < 2/10 in Rt hip.  Baseline: unable  Goal status: INITIAL   5.  Pt will be able to stand on Rt LE with level pelvis for 10 sec to demo improved glute med activation Baseline: unable  Goal status: INITIAL     PLAN: PT FREQUENCY: 2x/week   PT DURATION: 6 weeks   PLANNED INTERVENTIONS: Therapeutic exercises, Therapeutic activity, Neuromuscular re-education, Balance training, Gait training, Patient/Family education, Joint mobilization, Aquatic Therapy, Dry Needling, Electrical stimulation, Cryotherapy, Traction, Ionotophoresis 43m/ml Dexamethasone, Manual therapy, and Re-evaluation.   PLAN FOR NEXT SESSION: core, hip. Prone/quadruped  hip and core? Needs lateral hip strength (step ups, lateral walking ) when able- right now this is too painful.       SEvelene Croon PTA 03/19/2022, 12:10 PM   PHYSICAL THERAPY DISCHARGE SUMMARY  Visits from Start of Care:  3  Current functional level related to goals / functional outcomes: See above, unknown   Remaining deficits: See above for most recent info, see above    Education / Equipment: HEP, self care    Patient agrees to discharge. Patient goals were not met. Patient is being discharged due to not returning since the last visit. Reason unknown, patient cancelled all her appts on Mychart. JRaeford Razor PT 05/20/22 8:00 AM Phone: [phone]: [phone]

## 2022-03-25 ENCOUNTER — Ambulatory Visit: Payer: BC Managed Care – PPO | Admitting: Physical Therapy

## 2022-03-27 ENCOUNTER — Ambulatory Visit: Payer: BC Managed Care – PPO | Admitting: Physical Therapy

## 2022-03-31 ENCOUNTER — Ambulatory Visit: Payer: BC Managed Care – PPO | Admitting: Physical Therapy

## 2022-04-02 ENCOUNTER — Encounter: Payer: BC Managed Care – PPO | Admitting: Physical Therapy

## 2022-04-06 ENCOUNTER — Encounter: Payer: BC Managed Care – PPO | Admitting: Physical Therapy

## 2022-04-08 ENCOUNTER — Encounter: Payer: BC Managed Care – PPO | Admitting: Physical Therapy

## 2022-06-11 DIAGNOSIS — E7800 Pure hypercholesterolemia, unspecified: Secondary | ICD-10-CM | POA: Diagnosis not present

## 2022-06-11 DIAGNOSIS — Z79899 Other long term (current) drug therapy: Secondary | ICD-10-CM | POA: Diagnosis not present

## 2022-06-11 DIAGNOSIS — E559 Vitamin D deficiency, unspecified: Secondary | ICD-10-CM | POA: Diagnosis not present

## 2022-06-16 DIAGNOSIS — Z0000 Encounter for general adult medical examination without abnormal findings: Secondary | ICD-10-CM | POA: Diagnosis not present

## 2022-09-02 DIAGNOSIS — Z124 Encounter for screening for malignant neoplasm of cervix: Secondary | ICD-10-CM | POA: Diagnosis not present

## 2022-09-02 DIAGNOSIS — Z01419 Encounter for gynecological examination (general) (routine) without abnormal findings: Secondary | ICD-10-CM | POA: Diagnosis not present

## 2022-09-02 DIAGNOSIS — Z6827 Body mass index (BMI) 27.0-27.9, adult: Secondary | ICD-10-CM | POA: Diagnosis not present

## 2022-09-02 DIAGNOSIS — Z01411 Encounter for gynecological examination (general) (routine) with abnormal findings: Secondary | ICD-10-CM | POA: Diagnosis not present

## 2022-09-29 DIAGNOSIS — Z1211 Encounter for screening for malignant neoplasm of colon: Secondary | ICD-10-CM | POA: Diagnosis not present

## 2022-09-29 DIAGNOSIS — Z01818 Encounter for other preprocedural examination: Secondary | ICD-10-CM | POA: Diagnosis not present

## 2022-11-03 IMAGING — CR DG HIP (WITH OR WITHOUT PELVIS) 2-3V*R*
2 series · 2 of 2 positions shown · non-contrast
Comparison: None.

CLINICAL DATA: Posterior right hip pain for 6 months

EXAM:
DG HIP (WITH OR WITHOUT PELVIS) 2-3V RIGHT

[w hip ap right]
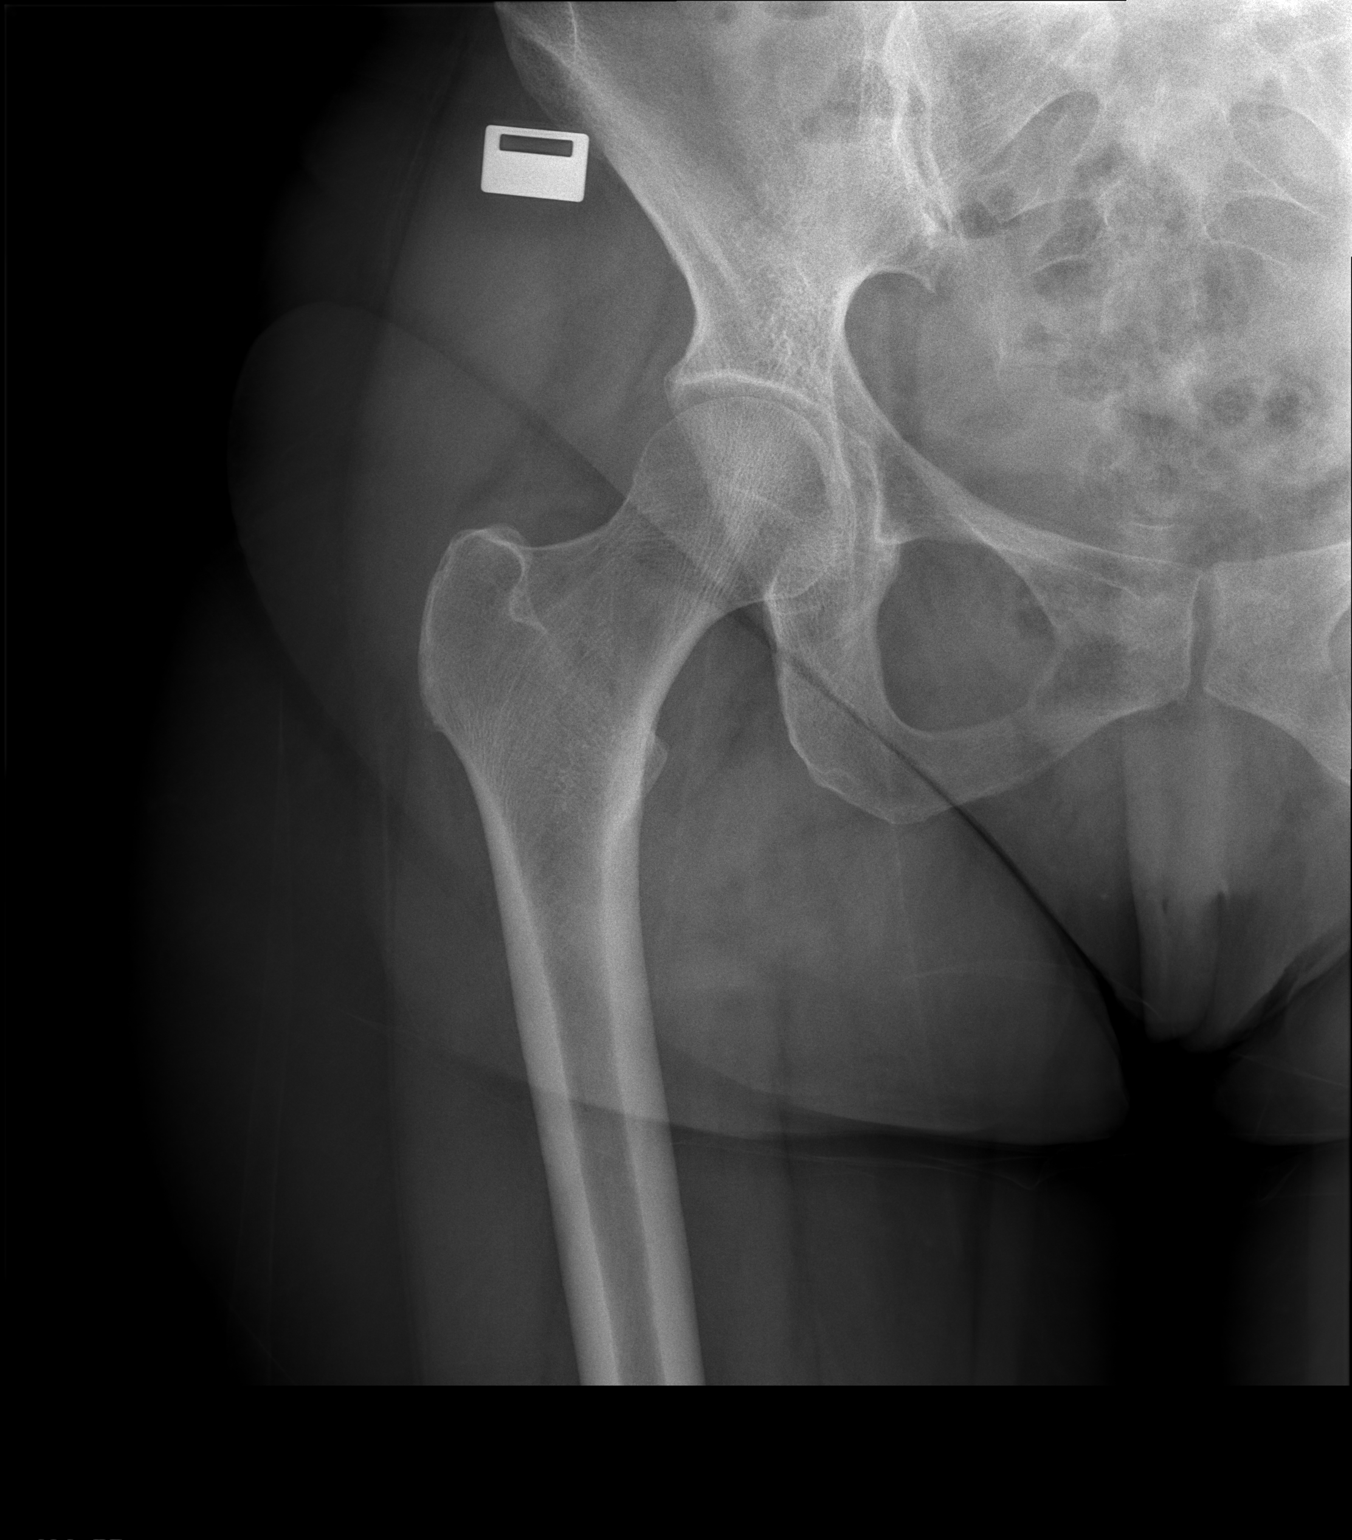

[w hip frog right]
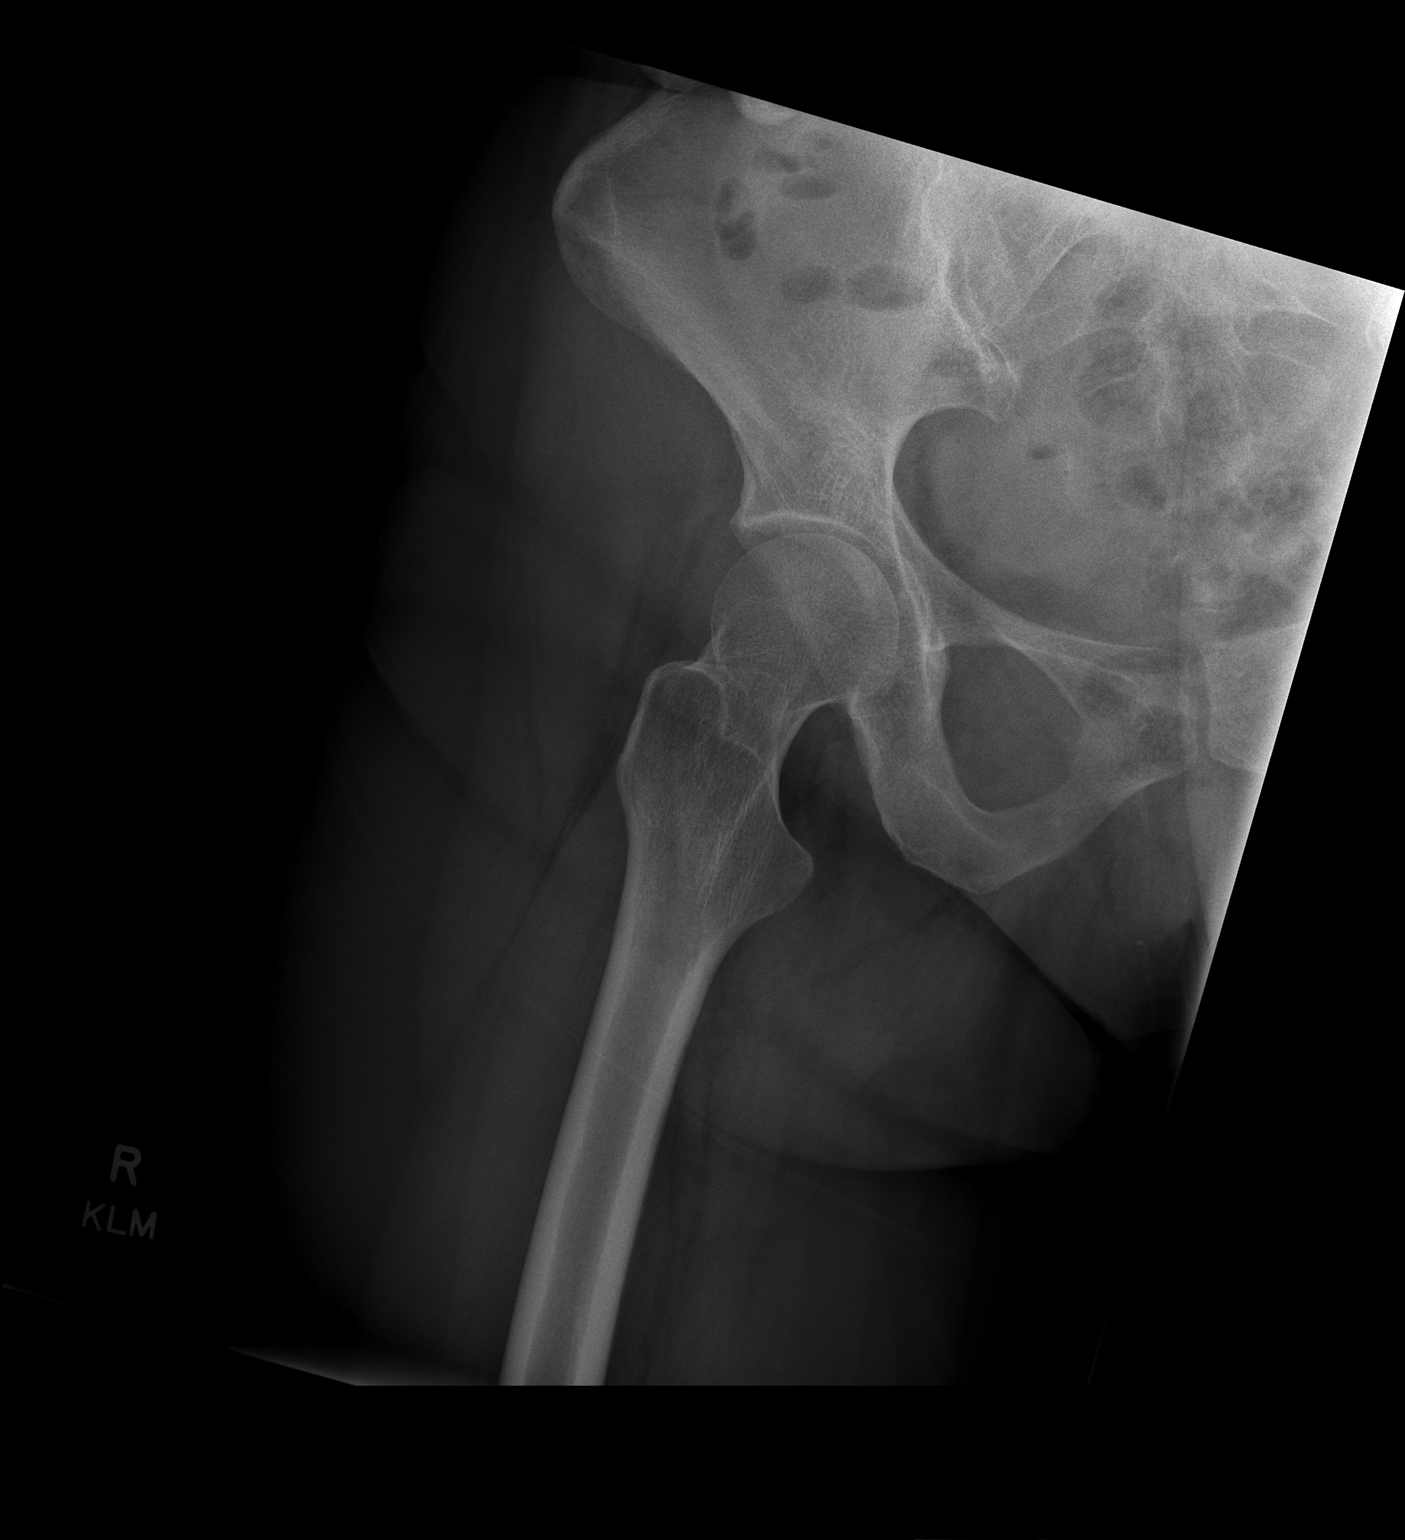

[2 of 2 positions shown; findings below may reference images not displayed]

FINDINGS: Frontal and frogleg lateral views of the right hip are obtained. No
fracture, subluxation, or dislocation. Joint spaces are well
preserved. Soft tissues are unremarkable.
IMPRESSION: 1. Unremarkable right hip.

## 2023-08-06 ENCOUNTER — Encounter (HOSPITAL_COMMUNITY): Payer: Self-pay | Admitting: Family Medicine

## 2023-08-06 ENCOUNTER — Inpatient Hospital Stay (HOSPITAL_BASED_OUTPATIENT_CLINIC_OR_DEPARTMENT_OTHER)
Admission: EM | Admit: 2023-08-06 | Discharge: 2023-08-12 | DRG: 096 | Disposition: A | Discharge status: Rehabilitation Facility | Payer: 59 | Attending: Internal Medicine | Admitting: Internal Medicine

## 2023-08-06 ENCOUNTER — Other Ambulatory Visit: Payer: Self-pay

## 2023-08-06 DIAGNOSIS — Z789 Other specified health status: Secondary | ICD-10-CM | POA: Insufficient documentation

## 2023-08-06 DIAGNOSIS — G610 Guillain-Barre syndrome: Secondary | ICD-10-CM | POA: Diagnosis present

## 2023-08-06 DIAGNOSIS — R531 Weakness: Secondary | ICD-10-CM | POA: Diagnosis not present

## 2023-08-06 DIAGNOSIS — Z79899 Other long term (current) drug therapy: Secondary | ICD-10-CM | POA: Diagnosis not present

## 2023-08-06 DIAGNOSIS — R21 Rash and other nonspecific skin eruption: Secondary | ICD-10-CM | POA: Diagnosis not present

## 2023-08-06 DIAGNOSIS — Z9181 History of falling: Secondary | ICD-10-CM

## 2023-08-06 DIAGNOSIS — M79672 Pain in left foot: Secondary | ICD-10-CM | POA: Diagnosis present

## 2023-08-06 DIAGNOSIS — Z8616 Personal history of COVID-19: Secondary | ICD-10-CM | POA: Diagnosis not present

## 2023-08-06 DIAGNOSIS — E538 Deficiency of other specified B group vitamins: Secondary | ICD-10-CM | POA: Diagnosis not present

## 2023-08-06 DIAGNOSIS — Z7982 Long term (current) use of aspirin: Secondary | ICD-10-CM | POA: Diagnosis not present

## 2023-08-06 DIAGNOSIS — E876 Hypokalemia: Secondary | ICD-10-CM | POA: Diagnosis present

## 2023-08-06 DIAGNOSIS — Z66 Do not resuscitate: Secondary | ICD-10-CM | POA: Diagnosis not present

## 2023-08-06 DIAGNOSIS — H02401 Unspecified ptosis of right eyelid: Secondary | ICD-10-CM | POA: Diagnosis not present

## 2023-08-06 DIAGNOSIS — M810 Age-related osteoporosis without current pathological fracture: Secondary | ICD-10-CM | POA: Diagnosis present

## 2023-08-06 DIAGNOSIS — R270 Ataxia, unspecified: Secondary | ICD-10-CM | POA: Diagnosis not present

## 2023-08-06 DIAGNOSIS — F419 Anxiety disorder, unspecified: Secondary | ICD-10-CM | POA: Diagnosis present

## 2023-08-06 DIAGNOSIS — K5900 Constipation, unspecified: Secondary | ICD-10-CM | POA: Diagnosis present

## 2023-08-06 DIAGNOSIS — K219 Gastro-esophageal reflux disease without esophagitis: Secondary | ICD-10-CM | POA: Diagnosis present

## 2023-08-06 DIAGNOSIS — G629 Polyneuropathy, unspecified: Principal | ICD-10-CM

## 2023-08-06 DIAGNOSIS — R202 Paresthesia of skin: Secondary | ICD-10-CM | POA: Insufficient documentation

## 2023-08-06 DIAGNOSIS — R278 Other lack of coordination: Secondary | ICD-10-CM | POA: Diagnosis present

## 2023-08-06 DIAGNOSIS — I10 Essential (primary) hypertension: Secondary | ICD-10-CM | POA: Diagnosis not present

## 2023-08-06 DIAGNOSIS — R262 Difficulty in walking, not elsewhere classified: Secondary | ICD-10-CM | POA: Diagnosis present

## 2023-08-06 DIAGNOSIS — R944 Abnormal results of kidney function studies: Secondary | ICD-10-CM | POA: Diagnosis present

## 2023-08-06 DIAGNOSIS — H499 Unspecified paralytic strabismus: Secondary | ICD-10-CM | POA: Diagnosis present

## 2023-08-06 DIAGNOSIS — M79671 Pain in right foot: Secondary | ICD-10-CM | POA: Diagnosis present

## 2023-08-06 LAB — HEPATIC FUNCTION PANEL
ALT: 15 U/L (ref 0–44)
AST: 18 U/L (ref 15–41)
Albumin: 4.2 g/dL (ref 3.5–5.0)
Alkaline Phosphatase: 90 U/L (ref 38–126)
Bilirubin, Direct: <0.1 mg/dL (ref 0.0–0.2)
Total Bilirubin: 0.5 mg/dL (ref 0.3–1.2)
Total Protein: 8.2 g/dL — ABNORMAL HIGH (ref 6.5–8.1)

## 2023-08-06 LAB — CSF CELL COUNT WITH DIFFERENTIAL
RBC Count, CSF: 11 /mm3 — ABNORMAL HIGH
RBC Count, CSF: 230 /mm3 — ABNORMAL HIGH
Tube #: 1
Tube #: 4
WBC, CSF: 1 /mm3 (ref 0–5)
WBC, CSF: 2 /mm3 (ref 0–5)

## 2023-08-06 LAB — MENINGITIS/ENCEPHALITIS PANEL (CSF)

## 2023-08-06 LAB — LIPASE, BLOOD: Lipase: 29 U/L (ref 11–51)

## 2023-08-06 LAB — BASIC METABOLIC PANEL
Anion gap: 7 (ref 5–15)
BUN: 17 mg/dL (ref 8–23)
CO2: 31 mmol/L (ref 22–32)
Calcium: 9.5 mg/dL (ref 8.9–10.3)
Chloride: 103 mmol/L (ref 98–111)
Creatinine, Ser: 0.81 mg/dL (ref 0.44–1.00)
GFR, Estimated: >60 mL/min (ref >60)
Glucose, Bld: 103 mg/dL — ABNORMAL HIGH (ref 70–99)
Potassium: 4.2 mmol/L (ref 3.5–5.1)
Sodium: 141 mmol/L (ref 135–145)

## 2023-08-06 LAB — CBC
HCT: 39.6 % (ref 36.0–46.0)
Hemoglobin: 12.3 g/dL (ref 12.0–15.0)
MCH: 25.9 pg — ABNORMAL LOW (ref 26.0–34.0)
MCHC: 31.1 g/dL (ref 30.0–36.0)
MCV: 83.5 fL (ref 80.0–100.0)
Platelets: 330 10*3/uL (ref 150–400)
RBC: 4.74 MIL/uL (ref 3.87–5.11)
RDW: 15.1 % (ref 11.5–15.5)
WBC: 6.1 10*3/uL (ref 4.0–10.5)
nRBC: 0 % (ref 0.0–0.2)

## 2023-08-06 LAB — HEMOGLOBIN A1C
Hgb A1c MFr Bld: 6 % — ABNORMAL HIGH (ref 4.8–5.6)
Mean Plasma Glucose: 125.5 mg/dL

## 2023-08-06 LAB — MAGNESIUM: Magnesium: 2 mg/dL (ref 1.7–2.4)

## 2023-08-06 LAB — VITAMIN B12: Vitamin B-12: 200 pg/mL (ref 180–914)

## 2023-08-06 LAB — PROTEIN AND GLUCOSE, CSF
Glucose, CSF: 63 mg/dL (ref 40–70)
Total  Protein, CSF: 50 mg/dL — ABNORMAL HIGH (ref 15–45)

## 2023-08-06 LAB — TSH: TSH: 0.368 u[IU]/mL (ref 0.350–4.500)

## 2023-08-06 MED ORDER — VITAMIN B-12 1000 MCG PO TABS
1000.0000 ug | ORAL_TABLET | Freq: Every day | ORAL | Status: DC
  Administered 2023-08-06 – 2023-08-12 (×7): 1000 ug via ORAL
  Filled 2023-08-06 (×7): qty 1

## 2023-08-06 MED ORDER — ONDANSETRON HCL 4 MG/2ML IJ SOLN
4.0000 mg | Freq: Four times a day (QID) | INTRAMUSCULAR | Status: DC | PRN
  Administered 2023-08-07 – 2023-08-11 (×7): 4 mg via INTRAVENOUS
  Filled 2023-08-06 (×7): qty 2

## 2023-08-06 MED ORDER — SODIUM CHLORIDE 0.9% FLUSH
3.0000 mL | Freq: Two times a day (BID) | INTRAVENOUS | Status: DC
  Administered 2023-08-06 – 2023-08-12 (×12): 3 mL via INTRAVENOUS

## 2023-08-06 MED ORDER — POLYETHYLENE GLYCOL 3350 17 G PO PACK
17.0000 g | PACK | Freq: Every day | ORAL | Status: DC | PRN
  Administered 2023-08-10 – 2023-08-11 (×2): 17 g via ORAL
  Filled 2023-08-06 (×2): qty 1

## 2023-08-06 MED ORDER — FAMOTIDINE 20 MG PO TABS
20.0000 mg | ORAL_TABLET | Freq: Every day | ORAL | Status: DC
  Administered 2023-08-06 – 2023-08-11 (×6): 20 mg via ORAL
  Filled 2023-08-06 (×9): qty 1

## 2023-08-06 MED ORDER — ALPRAZOLAM 0.5 MG PO TABS
1.0000 mg | ORAL_TABLET | Freq: Once | ORAL | Status: AC
  Administered 2023-08-06: 1 mg via ORAL
  Filled 2023-08-06: qty 2

## 2023-08-06 MED ORDER — TRAZODONE HCL 50 MG PO TABS
50.0000 mg | ORAL_TABLET | Freq: Every evening | ORAL | Status: DC | PRN
  Administered 2023-08-11: 50 mg via ORAL
  Filled 2023-08-06: qty 1

## 2023-08-06 MED ORDER — OXYCODONE HCL 5 MG PO TABS
5.0000 mg | ORAL_TABLET | ORAL | Status: DC | PRN
  Administered 2023-08-08: 5 mg via ORAL
  Filled 2023-08-06: qty 1

## 2023-08-06 MED ORDER — ONDANSETRON HCL 4 MG PO TABS: 4.0000 mg | ORAL_TABLET | Freq: Four times a day (QID) | ORAL | Status: DC | PRN

## 2023-08-06 MED ORDER — ACETAMINOPHEN 650 MG RE SUPP: 650.0000 mg | Freq: Four times a day (QID) | RECTAL | Status: DC | PRN

## 2023-08-06 MED ORDER — ACETAMINOPHEN 325 MG PO TABS
650.0000 mg | ORAL_TABLET | Freq: Four times a day (QID) | ORAL | Status: DC | PRN
  Administered 2023-08-08 – 2023-08-10 (×2): 650 mg via ORAL
  Filled 2023-08-06 (×2): qty 2

## 2023-08-06 MED ORDER — ASPIRIN 81 MG PO TBEC
81.0000 mg | DELAYED_RELEASE_TABLET | Freq: Every day | ORAL | Status: DC
  Administered 2023-08-06 – 2023-08-11 (×6): 81 mg via ORAL
  Filled 2023-08-06 (×9): qty 1

## 2023-08-06 MED ORDER — PAROXETINE HCL 20 MG PO TABS
20.0000 mg | ORAL_TABLET | Freq: Every day | ORAL | Status: DC
  Administered 2023-08-06 – 2023-08-11 (×6): 20 mg via ORAL
  Filled 2023-08-06 (×8): qty 1

## 2023-08-06 NOTE — ED Notes (Signed)
Lindzen paged

## 2023-08-06 NOTE — ED Notes (Signed)
Pt was assisted out of bed and on bedside commode. Pt was then assisted back into bed without incident.

## 2023-08-06 NOTE — Progress Notes (Signed)
Plan of Care Note for accepted transfer   Patient: Jill Allison MRN: [id number]   DOA: 08/06/2023  Facility requesting transfer: [facility] Requesting Provider: Dr.Trifan  Reason for transfer: polyneuropathy and ataxia  Facility course: 62 year old with no medical history who presented to ED with numerous symptoms. Had covid 2 weeks ago with mainly fatigue. Several days ago she started to have intermittent sharp pains in her chest and "sinus headache." Saw her PCP who did las and were told okay. Started on amoxicillin 3 days ago for sinus infection. Yesterday she started to have numbness and pain in her feet bilaterally and fingers. She also had drooping of her right eyelid. Saw PCP and started on gabapentin. Today she woke up and couldn't walk and looks like severe ataxia. Strength intact, but can't take steps without falling into anything.   Vitals: stable Pertinent labs: none  NIF: normal  No imaging In ED: neurology consulted. LP performed. ? Post viral syndrome? Neurology will consult on her in the hospital.   Plan of care: The patient is accepted for admission to Telemetry unit, at [facility].. Let neurology know she is here.    Author: Orland Mustard, MD 08/06/2023  Check [email or web address] for on-call coverage.  Nursing staff, Please call [facility] Admits & Consults System-Wide number on Amion as soon as patient's arrival, so appropriate admitting provider can evaluate the pt.

## 2023-08-06 NOTE — Assessment & Plan Note (Signed)
Some dysmetria on finger-to-nose testing, unclear etiology.  Does have borderline low B12 levels in stocking glove distribution of her paresthesias, however she has no anemia. - Neurology consultation pending - Start B12 supplementation - Methylmalonic acid level pending - PT/OT evaluation

## 2023-08-06 NOTE — Progress Notes (Signed)
RT Note:  NIF: -60 VC 3.6L  Great pt effort. MD at pt bedside.

## 2023-08-06 NOTE — ED Provider Notes (Signed)
[place] EMERGENCY DEPARTMENT AT [facility] Provider Note   CSN: [id number] Arrival date & time: 08/06/23  0709     History  Chief Complaint  Patient presents with   Extremity Pain    Jill Allison is a 62 y.o. female presenting from home with a constellation of symptoms.  Patient reports that she had COVID illness approximately 2 weeks ago, which she describes her symptoms as being extremely fatigued.  She reports she thought she had recovered from that but several days ago she began having intermittent sharp pains in her chest, as well as a sensation of "sinus headache".  She went to see her PCP twice this week in the office who performed several blood test, including checking her thyroid function, and were told there were no emergent findings, but started on amoxicillin 3 days ago for potential sinus infection.  The patient reports that yesterday she was having numbness and pain in her distal feet bilaterally and also her fingers.  She also noted drooping of her right eyelid yesterday.  Her PCP started her on gabapentin 100 mg 3 times daily which she has taken 2 doses of.    This morning she was concerned because she woke up and and she is discoordinated, unable to stand or walk steadily.  Her husband is present at the bedside.  Patient denies any other significant medical problems, reports normally she has healthy has not had issues with diabetes, neuropathy, or balance problems in the past.  He denies any headache or blurred vision to me.  She denies any active chest or epigastric pain.  She reports her neuropathy is better but she still has "pins sensation" in her fingers and toes.  She also reports a numbness sensation around her lip and upper teeth  HPI     Home Medications Prior to Admission medications   Medication Sig Start Date End Date Taking? Authorizing Provider  amoxicillin (AMOXIL) 875 MG tablet Take 875 mg by mouth 2 (two) times daily. 08/03/23 08/14/23  Yes [provider]  gabapentin (NEURONTIN) 100 MG capsule Take 1-3 capsules by mouth 2 (two) times daily as needed (for pain). 08/05/23   [provider]  PARoxetine (PAXIL) 20 MG tablet Take 20 mg by mouth daily.    [provider]      Allergies    Patient has no known allergies.    Review of Systems   Review of Systems  Physical Exam Updated Vital Signs BP (!) 169/92 (BP Location: Right Arm)   Pulse 88   Temp 98.2 F (36.8 C) (Oral)   Resp 19   Ht 5\' 6"  (1.676 m)   Wt 73.5 kg   LMP 10/19/2008   SpO2 93%   BMI 26.15 kg/m  Physical Exam Constitutional:      General: She is not in acute distress. HENT:     Head: Normocephalic and atraumatic.  Eyes:     Conjunctiva/sclera: Conjunctivae normal.     Pupils: Pupils are equal, round, and reactive to light.  Cardiovascular:     Rate and Rhythm: Normal rate and regular rhythm.  Pulmonary:     Effort: Pulmonary effort is normal. No respiratory distress.  Abdominal:     General: There is no distension.     Tenderness: There is no abdominal tenderness.  Skin:    General: Skin is warm and dry.  Neurological:     Mental Status: She is alert. Mental status is at baseline.  Comments: Right eye ptosis EOM intact No difficulty with finger to nose or heel shin testing Patient unable to ambulate, ataxia gait and wobbling No hyper-reflexia noted on exam Paresthesias reported in symmetrical glove-and-stocking distribution  Psychiatric:        Mood and Affect: Mood normal.        Behavior: Behavior normal.     ED Results / Procedures / Treatments   Labs (all labs ordered are listed, but only abnormal results are displayed) Labs Reviewed  BASIC METABOLIC PANEL - Abnormal; Notable for the following components:      Result Value   Glucose, Bld 103 (*)    All other components within normal limits  CBC - Abnormal; Notable for the following components:   MCH 25.9 (*)    All other components within  normal limits  HEPATIC FUNCTION PANEL - Abnormal; Notable for the following components:   Total Protein 8.2 (*)    All other components within normal limits  PROTEIN AND GLUCOSE, CSF - Abnormal; Notable for the following components:   Total  Protein, CSF 50 (*)    All other components within normal limits  CSF CULTURE W GRAM STAIN  MAGNESIUM  LIPASE, BLOOD  VITAMIN B12  MENINGITIS/ENCEPHALITIS PANEL (CSF)  CSF CELL COUNT WITH DIFFERENTIAL  CSF CELL COUNT WITH DIFFERENTIAL    EKG EKG Interpretation Date/Time:  Friday August 06 2023 07:20:55 EDT Ventricular Rate:  99 PR Interval:  158 QRS Duration:  80 QT Interval:  354 QTC Calculation: 455 R Axis:   -5  Text Interpretation: Sinus rhythm Confirmed by Alvester Chou [phone]) on 08/06/2023 8:05:33 AM  Radiology No results found.  Procedures .Lumbar Puncture  Date/Time: 08/06/2023 5:27 PM  Performed by: Terald Sleeper, MD Authorized by: Terald Sleeper, MD   Consent:    Consent obtained:  Verbal   Consent given by:  Patient   Risks discussed:  Infection, nerve damage, pain, repeat procedure, bleeding and headache Universal protocol:    Procedure explained and questions answered to patient or proxy's satisfaction: yes     Relevant documents present and verified: yes     Test results available: yes     Imaging studies available: yes     Required blood products, implants, devices, and special equipment available: yes     Immediately prior to procedure a time out was called: yes     Site/side marked: yes     Patient identity confirmed:  Arm band Pre-procedure details:    Procedure purpose:  Diagnostic   Preparation: Patient was prepped and draped in usual sterile fashion   Sedation:    Sedation type:  Anxiolysis Anesthesia:    Anesthesia method:  Local infiltration   Local anesthetic:  Lidocaine 1% w/o epi Procedure details:    Lumbar space:  L4-L5 interspace   Patient position:  L lateral decubitus   Needle  gauge:  22   Needle length (in):  2.5   Number of attempts:  1   Fluid appearance:  Clear   Tubes of fluid:  4   Total volume (ml):  4 Post-procedure details:    Puncture site:  Adhesive bandage applied and direct pressure applied   Procedure completion:  Tolerated well, no immediate complications     Medications Ordered in ED Medications  ALPRAZolam Prudy Feeler) tablet 1 mg (1 mg Oral Given 08/06/23 1243)    ED Course/ Medical Decision Making/ A&P Clinical Course as of 08/06/23 1732  Fri Aug 06, 2023  1235  Dr Otelia Limes and Wilford Corner aware of patient - advising attempt LP for CSF studies, medical admit to hospital for neuro consult. [MT]  1331 LP successful - CSF studies to be sent to lab, admit ot [facility] [MT]    Clinical Course User Index [MT] Carinna Newhart, Kermit Balo, MD                                 Medical Decision Making Amount and/or Complexity of Data Reviewed Labs: ordered.  Risk Prescription drug management. Decision regarding hospitalization.   This patient presents to the ED with concern for paresthesias, weakness of the legs/ataxia, ptosis of right eye. This involves an extensive number of treatment options, and is a complaint that carries with it a high risk of complications and morbidity.  The differential diagnosis includes polyneuropathy with ataxia including viral illness, GBS, electrolyte derangement, post-covid syndrome vs myasthenia gravis new onset  Co-morbidities that complicate the patient evaluation: recent covid viral infection  Additional history obtained from husband at bedside   I ordered and personally interpreted labs.  The pertinent results include:  BMP, CBC unremarkable.  CSF with protein 50 mildly elevated, glucose wnl, meningitis panel negative.  B12 level wnl   The patient was maintained on a cardiac monitor.  I personally viewed and interpreted the cardiac monitored which showed an underlying rhythm of: NSR  Per my interpretation the patient's ECG  shows no acute ischemic findings  Test Considered: they patients' symptoms do not localize to a stroke lesion; at this time no indication for emergent CT imaging of the brain.  MRI imaging deferred to neurology/inpatient service upon admission  Isolated ptsosis with no miosis or immediate concern for Horner syndrome for stat angiogram imaging at this time.     I requested consultation with the neurology,  and discussed lab and imaging findings as well as pertinent plan - they recommend: LP, neuro consult inpatient  After the interventions noted above, I reevaluated the patient and found that they have: stayed the same    Dispostion:  After consideration of the diagnostic results and the patients response to treatment, I feel that the patent would benefit from medical admission.  Patient is stable on admission with no respiratory distress, NIF -60 within normal limits; no indication for intubation or ICU admission at this time.           Final Clinical Impression(s) / ED Diagnoses Final diagnoses:  Polyneuropathy  Ataxia  Ptosis of right eyelid    Rx / DC Orders ED Discharge Orders     None         Prerna Harold, Kermit Balo, MD 08/06/23 [phone]

## 2023-08-06 NOTE — Assessment & Plan Note (Signed)
Stocking glove distribution of both hands and feet, suspect this is due to B12 but will workup other potential causes as well. - Check TSH, methylmalonic acid, A1c

## 2023-08-06 NOTE — ED Notes (Signed)
Report given to the next RN.Marland KitchenMarland Kitchen

## 2023-08-06 NOTE — H&P (Signed)
History and Physical    Patient: Jill Allison ZOX:[id number] DOB: 19-Jan-1961 DOA: 08/06/2023 DOS: the patient was seen and examined on 08/06/2023 PCP: Mila Palmer, MD  Patient coming from: Home  Chief Complaint:  Chief Complaint  Patient presents with   Extremity Pain   HPI: Jill Allison is a 62 y.o. female with history of anxiety who presented to the [facility] with multiple symptoms.  Per review of chart patient presented to the dropper to the ED this morning reporting fatigue she attributed to COVID infection two weeks prior, intermittent sharp pains in her chest, sinus headache, numbness and pain in her feet and fingers, drooping of her right eyelid, and difficulty standing and walking. Per review of chart patient went to her PCPs office twice this week, had some blood test done and also started on amoxicillin for potential sinus infection for entheses Dr. Malvin Johns fluid behind her ear).  Also started on gabapentin 100 mg 3 times daily which she had taken a few doses of.  Per review of labs in care everywhere there were no abnormalities found. On my history she confirms the above, also adds that she was not experiencing dizziness or nausea over the past week.  Currently her most bothersome symptoms are numbness and tingling in her hands and feet as well as a feeling like she cannot properly grab things with her hands and feeling like she is going to fall if she tries to walk.  In the ED vital signs are notable only for hypertension, otherwise normal.  EKG was unremarkable.  Vitamin B12 was 200.  CBC and CMP were unremarkable.  No imaging was performed.  A lumbar puncture was performed currently results show negative CSF PCR panel for meningitis encephalitis, normal glucose, borderline elevated protein.  CSF cell counts with differentials are still pending.  Neurology was consulted and recommended transfer to [facility] for in person consultation, she was transferred and admitted  for further evaluation.    Review of Systems:  Pertinent positives and negative per HPI, all others reviewed and negative  No past medical history on file. No past surgical history on file. Social History:  reports that she does not drink alcohol and does not use drugs. No history on file for tobacco use.  No Known Allergies  No family history on file.  Prior to Admission medications   Medication Sig Start Date End Date Taking? Authorizing Provider  amoxicillin (AMOXIL) 875 MG tablet Take 875 mg by mouth 2 (two) times daily. 08/03/23 08/14/23 Yes [provider]  gabapentin (NEURONTIN) 100 MG capsule Take 1-3 capsules by mouth 2 (two) times daily as needed (for pain). 08/05/23   [provider]  PARoxetine (PAXIL) 20 MG tablet Take 20 mg by mouth daily.    [provider]    Physical Exam: Vitals:   08/06/23 1030 08/06/23 1100 08/06/23 1113 08/06/23 1543  BP: (!) 166/93 (!) 156/86  (!) 169/92  Pulse: 96 95  88  Resp: (!) 21 19    Temp:   98.1 F (36.7 C) 98.2 F (36.8 C)  TempSrc:   Oral Oral  SpO2: 99% 95%  93%  Weight:      Height:       Physical Exam Constitutional:      General: She is not in acute distress.    Appearance: Normal appearance. She is not ill-appearing, toxic-appearing or diaphoretic.  HENT:     Head: Normocephalic and atraumatic.     Mouth/Throat:  Mouth: Mucous membranes are moist.  Eyes:     General: No scleral icterus.    Extraocular Movements: Extraocular movements intact.     Pupils: Pupils are equal, round, and reactive to light.  Cardiovascular:     Rate and Rhythm: Normal rate and regular rhythm.     Heart sounds: Normal heart sounds.  Pulmonary:     Effort: Pulmonary effort is normal.     Breath sounds: Normal breath sounds.  Abdominal:     General: Abdomen is flat. Bowel sounds are normal. There is no distension.     Palpations: Abdomen is soft. There is no mass.     Tenderness: There is no abdominal  tenderness.  Skin:    General: Skin is warm and dry.  Neurological:     Mental Status: She is alert.     Comments: Cranial nerves intact with exception of visual fields which were not tested.  Dysmetria with finger-to-nose, most notable on patient's right side.  No pronator drift but hands did intermittently overlap each other with eyes closed.  Strength 5 out of 5 in left upper extremity and bilateral lower extremities, 4 out of 5 in right upper extremity.  Negative Hoffmann sign bilaterally.  Normal heel-to-shin bilaterally.  Normal biceps and patellar reflexes.  No clonus.  Psychiatric:        Mood and Affect: Mood normal.        Behavior: Behavior normal.      Data Reviewed: Results for orders placed or performed during the hospital encounter of 08/06/23 (from the past 24 hour(s))  Basic metabolic panel     Status: Abnormal   Collection Time: 08/06/23  8:50 AM  Result Value Ref Range   Sodium 141 135 - 145 mmol/L   Potassium 4.2 3.5 - 5.1 mmol/L   Chloride 103 98 - 111 mmol/L   CO2 31 22 - 32 mmol/L   Glucose, Bld 103 (H) 70 - 99 mg/dL   BUN 17 8 - 23 mg/dL   Creatinine, Ser 1.69 0.44 - 1.00 mg/dL   Calcium 9.5 8.9 - 67.8 mg/dL   GFR, Estimated >93 >81 mL/min   Anion gap 7 5 - 15  CBC     Status: Abnormal   Collection Time: 08/06/23  8:50 AM  Result Value Ref Range   WBC 6.1 4.0 - 10.5 K/uL   RBC 4.74 3.87 - 5.11 MIL/uL   Hemoglobin 12.3 12.0 - 15.0 g/dL   HCT 01.7 51.0 - 25.8 %   MCV 83.5 80.0 - 100.0 fL   MCH 25.9 (L) 26.0 - 34.0 pg   MCHC 31.1 30.0 - 36.0 g/dL   RDW 52.7 78.2 - 42.3 %   Platelets 330 150 - 400 K/uL   nRBC 0.0 0.0 - 0.2 %  Magnesium     Status: None   Collection Time: 08/06/23  8:50 AM  Result Value Ref Range   Magnesium 2.0 1.7 - 2.4 mg/dL  Lipase, blood     Status: None   Collection Time: 08/06/23  8:50 AM  Result Value Ref Range   Lipase 29 11 - 51 U/L  Hepatic function panel     Status: Abnormal   Collection Time: 08/06/23  8:50 AM  Result  Value Ref Range   Total Protein 8.2 (H) 6.5 - 8.1 g/dL   Albumin 4.2 3.5 - 5.0 g/dL   AST 18 15 - 41 U/L   ALT 15 0 - 44 U/L   Alkaline Phosphatase 90  38 - 126 U/L   Total Bilirubin 0.5 0.3 - 1.2 mg/dL   Bilirubin, Direct <4.0 0.0 - 0.2 mg/dL   Indirect Bilirubin NOT CALCULATED 0.3 - 0.9 mg/dL  Vitamin J81     Status: None   Collection Time: 08/06/23  8:50 AM  Result Value Ref Range   Vitamin B-12 200 180 - 914 pg/mL  Protein and glucose, CSF     Status: Abnormal   Collection Time: 08/06/23  1:30 PM  Result Value Ref Range   Glucose, CSF 63 40 - 70 mg/dL   Total  Protein, CSF 50 (H) 15 - 45 mg/dL  CSF culture w Gram Stain     Status: None (Preliminary result)   Collection Time: 08/06/23  1:30 PM   Specimen: CSF; Cerebrospinal Fluid  Result Value Ref Range   Specimen Description      CSF Performed at [facility], [address] [place], [place] [address]    Special Requests      NONE Performed at [facility], [address] [place], [place] [address]    Gram Stain PENDING    Culture PENDING    Report Status PENDING   Meningitis/Encephalitis Panel (CSF)     Status: None   Collection Time: 08/06/23  1:30 PM  Result Value Ref Range   Cryptococcus neoformans/gattii (CSF) NOT DETECTED NOT DETECTED   Cytomegalovirus (CSF) NOT DETECTED NOT DETECTED   Enterovirus (CSF) NOT DETECTED NOT DETECTED   Escherichia coli K1 (CSF) NOT DETECTED NOT DETECTED   Haemophilus influenzae (CSF) NOT DETECTED NOT DETECTED   Herpes simplex virus 1 (CSF) NOT DETECTED NOT DETECTED   Herpes simplex virus 2 (CSF) NOT DETECTED NOT DETECTED   Human herpesvirus 6 (CSF) NOT DETECTED NOT DETECTED   Human parechovirus (CSF) NOT DETECTED NOT DETECTED   Listeria monocytogenes (CSF) NOT DETECTED NOT DETECTED   Neisseria meningitis (CSF) NOT DETECTED NOT DETECTED   Streptococcus agalactiae (CSF) NOT DETECTED NOT DETECTED   Streptococcus pneumoniae (CSF) NOT DETECTED NOT DETECTED    Varicella zoster virus (CSF) NOT DETECTED NOT DETECTED  CSF cell count with differential collection tube #: 1     Status: Abnormal   Collection Time: 08/06/23  1:30 PM  Result Value Ref Range   Tube # 1    Color, CSF COLORLESS COLORLESS   Appearance, CSF CLEAR CLEAR   Supernatant NOT INDICATED    RBC Count, CSF 230 (H) 0 /cu mm   WBC, CSF 2 0 - 5 /cu mm   Other Cells, CSF TOO FEW TO COUNT, SMEAR AVAILABLE FOR REVIEW   CSF cell count with differential collection tube #: 4     Status: Abnormal   Collection Time: 08/06/23  1:30 PM  Result Value Ref Range   Tube # 4    Color, CSF COLORLESS COLORLESS   Appearance, CSF CLEAR CLEAR   Supernatant NOT INDICATED    RBC Count, CSF 11 (H) 0 /cu mm   WBC, CSF 1 0 - 5 /cu mm   Other Cells, CSF TOO FEW TO COUNT, SMEAR AVAILABLE FOR REVIEW    DG HIP UNILAT WITH PELVIS 2-3 VIEWS RIGHT CLINICAL DATA:  Posterior right hip pain for 6 months  EXAM: DG HIP (WITH OR WITHOUT PELVIS) 2-3V RIGHT  COMPARISON:  None.  FINDINGS: Frontal and frogleg lateral views of the right hip are obtained. No fracture, subluxation, or dislocation. Joint spaces are well preserved. Soft tissues are unremarkable.  IMPRESSION: 1. Unremarkable right  hip.  Electronically Signed   By: Sharlet Salina M.D.   On: 05/20/2021 23:33     Assessment and Plan: ARLOW GERLEMAN is a 62 y.o. female with history of anxiety who presented to the [facility] ED with distal extremity numbness and tingling and ataxia.   * Ataxia Some dysmetria on finger-to-nose testing, unclear etiology.  Does have borderline low B12 levels in stocking glove distribution of her paresthesias, however she has no anemia. - Neurology consultation pending - Start B12 supplementation - Methylmalonic acid level pending - PT/OT evaluation  Paresthesias Stocking glove distribution of both hands and feet, suspect this is due to B12 but will workup other potential causes as well. - Check TSH,  methylmalonic acid, A1c     Advance Care Planning:   Code Status: Full Code , confirmed with patient  Consults: Neurology  Family Communication: Husband updated at bedside  Severity of Illness: The appropriate patient status for this patient is OBSERVATION. Observation status is judged to be reasonable and necessary in order to provide the required intensity of service to ensure the patient's safety. The patient's presenting symptoms, physical exam findings, and initial radiographic and laboratory data in the context of their medical condition is felt to place them at decreased risk for further clinical deterioration. Furthermore, it is anticipated that the patient will be medically stable for discharge from the hospital within 2 midnights of admission.   Author: Venora Maples, MD 08/06/2023 6:07 PM  For on call review [email or web address].

## 2023-08-06 NOTE — ED Notes (Signed)
Marthe Patch paged per Dr Renaye Rakers

## 2023-08-06 NOTE — ED Notes (Signed)
Lumbar Puncture supplies at bedside for EDP

## 2023-08-06 NOTE — ED Triage Notes (Signed)
Pt c/o increasing pain and weakness in extremities bilateral. Pt reports she was COVID+ approx 2 weeks ago, further reporting Tuesday seen at PCP for abd pain, tx with PO abx with URI. Pt went to PCP yesterday d/t pain in feet bilateral, numbness/tingling in hands bilateral, numbness in tongue and droop in R eye. Pt was started on gabapentin yesterday and had lab work done at PCP.

## 2023-08-06 NOTE — Plan of Care (Signed)
[facility] Tonsillectomy/Adenoidectomy, Postoperative PEDS care plan entered in error.

## 2023-08-06 NOTE — ED Notes (Signed)
Monisha with cl called for transport

## 2023-08-07 DIAGNOSIS — Z66 Do not resuscitate: Secondary | ICD-10-CM | POA: Diagnosis present

## 2023-08-07 DIAGNOSIS — Z7982 Long term (current) use of aspirin: Secondary | ICD-10-CM | POA: Diagnosis not present

## 2023-08-07 DIAGNOSIS — F419 Anxiety disorder, unspecified: Secondary | ICD-10-CM | POA: Diagnosis present

## 2023-08-07 DIAGNOSIS — E876 Hypokalemia: Secondary | ICD-10-CM | POA: Diagnosis present

## 2023-08-07 DIAGNOSIS — Z79899 Other long term (current) drug therapy: Secondary | ICD-10-CM | POA: Diagnosis not present

## 2023-08-07 DIAGNOSIS — R944 Abnormal results of kidney function studies: Secondary | ICD-10-CM | POA: Diagnosis present

## 2023-08-07 DIAGNOSIS — H02401 Unspecified ptosis of right eyelid: Secondary | ICD-10-CM | POA: Diagnosis present

## 2023-08-07 DIAGNOSIS — M810 Age-related osteoporosis without current pathological fracture: Secondary | ICD-10-CM | POA: Diagnosis present

## 2023-08-07 DIAGNOSIS — K5900 Constipation, unspecified: Secondary | ICD-10-CM | POA: Diagnosis present

## 2023-08-07 DIAGNOSIS — Z8616 Personal history of COVID-19: Secondary | ICD-10-CM | POA: Diagnosis not present

## 2023-08-07 DIAGNOSIS — K219 Gastro-esophageal reflux disease without esophagitis: Secondary | ICD-10-CM | POA: Diagnosis present

## 2023-08-07 DIAGNOSIS — G610 Guillain-Barre syndrome: Secondary | ICD-10-CM | POA: Diagnosis present

## 2023-08-07 DIAGNOSIS — M79671 Pain in right foot: Secondary | ICD-10-CM | POA: Diagnosis present

## 2023-08-07 DIAGNOSIS — I10 Essential (primary) hypertension: Secondary | ICD-10-CM | POA: Diagnosis present

## 2023-08-07 DIAGNOSIS — Z9181 History of falling: Secondary | ICD-10-CM | POA: Diagnosis not present

## 2023-08-07 DIAGNOSIS — R262 Difficulty in walking, not elsewhere classified: Secondary | ICD-10-CM | POA: Diagnosis present

## 2023-08-07 DIAGNOSIS — M79672 Pain in left foot: Secondary | ICD-10-CM | POA: Diagnosis present

## 2023-08-07 DIAGNOSIS — R531 Weakness: Secondary | ICD-10-CM | POA: Diagnosis present

## 2023-08-07 DIAGNOSIS — E538 Deficiency of other specified B group vitamins: Secondary | ICD-10-CM | POA: Diagnosis present

## 2023-08-07 DIAGNOSIS — H499 Unspecified paralytic strabismus: Secondary | ICD-10-CM | POA: Diagnosis present

## 2023-08-07 DIAGNOSIS — R21 Rash and other nonspecific skin eruption: Secondary | ICD-10-CM | POA: Diagnosis present

## 2023-08-07 DIAGNOSIS — R278 Other lack of coordination: Secondary | ICD-10-CM | POA: Diagnosis present

## 2023-08-07 DIAGNOSIS — R270 Ataxia, unspecified: Secondary | ICD-10-CM | POA: Diagnosis present

## 2023-08-07 LAB — CBC
HCT: 38.7 % (ref 36.0–46.0)
Hemoglobin: 11.9 g/dL — ABNORMAL LOW (ref 12.0–15.0)
MCH: 25.2 pg — ABNORMAL LOW (ref 26.0–34.0)
MCHC: 30.7 g/dL (ref 30.0–36.0)
MCV: 82 fL (ref 80.0–100.0)
Platelets: 319 10*3/uL (ref 150–400)
RBC: 4.72 MIL/uL (ref 3.87–5.11)
RDW: 14.9 % (ref 11.5–15.5)
WBC: 5.8 10*3/uL (ref 4.0–10.5)
nRBC: 0 % (ref 0.0–0.2)

## 2023-08-07 LAB — COMPREHENSIVE METABOLIC PANEL
ALT: 18 U/L (ref 0–44)
AST: 21 U/L (ref 15–41)
Albumin: 3.6 g/dL (ref 3.5–5.0)
Alkaline Phosphatase: 73 U/L (ref 38–126)
Anion gap: 8 (ref 5–15)
BUN: 16 mg/dL (ref 8–23)
CO2: 28 mmol/L (ref 22–32)
Calcium: 9.3 mg/dL (ref 8.9–10.3)
Chloride: 103 mmol/L (ref 98–111)
Creatinine, Ser: 0.73 mg/dL (ref 0.44–1.00)
GFR, Estimated: >60 mL/min (ref >60)
Glucose, Bld: 99 mg/dL (ref 70–99)
Potassium: 4.1 mmol/L (ref 3.5–5.1)
Sodium: 139 mmol/L (ref 135–145)
Total Bilirubin: 0.6 mg/dL (ref 0.3–1.2)
Total Protein: 7.3 g/dL (ref 6.5–8.1)

## 2023-08-07 MED ORDER — AMLODIPINE BESYLATE 5 MG PO TABS
5.0000 mg | ORAL_TABLET | Freq: Every day | ORAL | Status: DC
  Administered 2023-08-07: 5 mg via ORAL
  Filled 2023-08-07: qty 1

## 2023-08-07 MED ORDER — IMMUNE GLOBULIN (HUMAN) 10 GM/100ML IV SOLN
400.0000 mg/kg | INTRAVENOUS | Status: AC
  Administered 2023-08-07 – 2023-08-11 (×5): 25 g via INTRAVENOUS
  Filled 2023-08-07 (×5): qty 250

## 2023-08-07 MED ORDER — IMMUNE GLOBULIN (HUMAN) 10 GM/100ML IV SOLN
400.0000 mg/kg | INTRAVENOUS | Status: DC
  Filled 2023-08-07: qty 250

## 2023-08-07 NOTE — Evaluation (Signed)
Physical Therapy Evaluation Patient Details Name: Jill Allison MRN: [id number] DOB: 04-Nov-1960 Today's Date: 08/07/2023  History of Present Illness  62 y.o. female admitted to [facility] on 08/06/23 due to fatigue, generalized pain and bodyaches, numbness/tingling in hands and feet. 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 Pt started on IVIG on 08/07/23 for a 5 day course. No PMH.  Clinical Impression  Patient presents with decreased mobility due to deficits listed in PT problem list.  Able to get to EOB with max A  and sit with min to mod A demonstrating ataxia with poor sequencing/timing/grading of movement.  She was weaker in UE's than LE's and able to stand with max A though with poor proprioception with excessive ankle DF and hyperextension at hips needing A to push hips back for safety to sit on EOB.  Previously independent and active working and helping her parents.  Spouse present and supportive.  Feel she will benefit from continued skilled PT in the acute setting and from intensive inpatient rehab (>3 hours/day) at d/c.       If plan is discharge home, recommend the following: Two people to help with bathing/dressing/bathroom;Two people to help with walking and/or transfers;Assist for transportation;Assistance with cooking/housework;Help with stairs or ramp for entrance   Can travel by private vehicle        Equipment Recommendations Other (comment) (TBA)  Recommendations for Other Services  Rehab consult    Functional Status Assessment Patient has had a recent decline in their functional status and demonstrates the ability to make significant improvements in function in a reasonable and predictable amount of time.     Precautions / Restrictions Precautions Precautions: Fall Precaution Comments: ataxia Restrictions Weight Bearing Restrictions: No      Mobility  Bed Mobility Overal bed mobility: Needs Assistance Bed  Mobility: Rolling, Sidelying to Sit Rolling: Max assist, Used rails Sidelying to sit: Max assist, Used rails       General bed mobility comments: cues for technique to use UE's and A for lifting trunk, moving legs off EOB and scooting to EOB    Transfers Overall transfer level: Needs assistance Equipment used:  (back of recliner in front for handles to pull up) Transfers: Sit to/from Stand Sit to Stand: Total assist, From elevated surface           General transfer comment: stood from EOB at elevated height; patient with one hand on back of recliner and other around PT shoulder and PT blocking knees; once standing transitioned hand to recliner and noted excessive ankle DF and knee extension so assisted to push hips back onto bed to sit    Ambulation/Gait               General Gait Details: unable  Stairs            Wheelchair Mobility     Tilt Bed    Modified Rankin (Stroke Patients Only)       Balance Overall balance assessment: Needs assistance Sitting-balance support: Feet supported Sitting balance-Leahy Scale: Poor Sitting balance - Comments: with hands in lap and flexed posture sitting with S then with any dynamic movement noted ataxia and LOB with min to mod A to recover   Standing balance support: Bilateral upper extremity supported Standing balance-Leahy Scale: Zero Standing balance comment: mod A and heavy UE support for standing  Pertinent Vitals/Pain Pain Assessment Pain Score: 5  Pain Location: hands and feet Pain Descriptors / Indicators: Aching, Discomfort, Tingling Pain Intervention(s): Monitored during session, Repositioned, Limited activity within patient's tolerance    Home Living Family/patient expects to be discharged to:: Private residence Living Arrangements: Spouse/significant other Available Help at Discharge: Family Type of Home: House Home Access: Ramped entrance       Home  Layout: One level Home Equipment: Shower seat - built in Additional Comments: recently retired to spend time with her parents who are elderly; 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 (?)    Prior Function Prior Level of Function : Independent/Modified Independent;Driving             Mobility Comments: No DME ADLs Comments: Indep     Extremity/Trunk Assessment   Upper Extremity Assessment Upper Extremity Assessment: Defer to OT evaluation RUE Deficits / Details: Ataxic and weak - 3/5 overall RUE Sensation: decreased light touch;decreased proprioception RUE Coordination: decreased fine motor;decreased gross motor LUE Deficits / Details: Ataxic and weak - 3/5 in all mobility LUE Sensation: decreased light touch;decreased proprioception LUE Coordination: decreased fine motor;decreased gross motor    Lower Extremity Assessment Lower Extremity Assessment: RLE deficits/detail;LLE deficits/detail RLE Deficits / Details: AROM WFL, strength hip flexion 4/5, knee extension 4+/5, ankle DF 4+/5, numbness in feet/tingling/decreased proprioception; ataxic movements with difficulty grading RLE Sensation: decreased proprioception;decreased light touch RLE Coordination: decreased gross motor;decreased fine motor LLE Deficits / Details: AROM WFL, strength hip flexion 4/5, knee extension 4+/5, ankle DF 4+/5, numbness in feet/tingling/decreased proprioception; ataxic movements with difficulty grading LLE Sensation: decreased light touch;decreased proprioception LLE Coordination: decreased gross motor;decreased fine motor    Cervical / Trunk Assessment Cervical / Trunk Assessment: Other exceptions Cervical / Trunk Exceptions: core weakness, ataxic movements in trunk  Communication   Communication Communication: No apparent difficulties  Cognition Arousal: Alert Behavior During Therapy: WFL for tasks assessed/performed Overall Cognitive Status: Within Functional Limits for tasks  assessed                                          General Comments General comments (skin integrity, edema, etc.): Spouse in the room and dinner tray delivered during session.  Set up and pt able to scoop with fork and eat with minimal spillage.  Still with awkward hand position at times    Exercises     Assessment/Plan    PT Assessment Patient needs continued PT services  PT Problem List Decreased strength;Impaired sensation;Decreased coordination;Decreased mobility;Decreased balance;Decreased activity tolerance;Decreased safety awareness;Decreased knowledge of use of DME       PT Treatment Interventions DME instruction;Neuromuscular re-education;Gait training;Patient/family education;Functional mobility training;Therapeutic activities;Therapeutic exercise;Balance training;Stair training;Wheelchair mobility training    PT Goals (Current goals can be found in the Care Plan section)  Acute Rehab PT Goals Patient Stated Goal: return home; more independent PT Goal Formulation: With patient/family Time For Goal Achievement: 08/20/23    Frequency Min 1X/week     Co-evaluation               AM-PAC PT "6 Clicks" Mobility  Outcome Measure Help needed turning from your back to your side while in a flat bed without using bedrails?: A Lot Help needed moving from lying on your back to sitting on the side of a flat bed without using bedrails?: Total Help needed moving to and from a bed to  a chair (including a wheelchair)?: Total Help needed standing up from a chair using your arms (e.g., wheelchair or bedside chair)?: Total Help needed to walk in hospital room?: Total Help needed climbing 3-5 steps with a railing? : Total 6 Click Score: 7    End of Session Equipment Utilized During Treatment: Gait belt Activity Tolerance: Patient tolerated treatment well Patient left: in bed;with call bell/phone within reach;with bed alarm set;with family/visitor present   PT  Visit Diagnosis: Other abnormalities of gait and mobility (R26.89);Muscle weakness (generalized) (M62.81);Ataxic gait (R26.0);Other symptoms and signs involving the nervous system (R29.898)    Time: 1610-9604 PT Time Calculation (min) (ACUTE ONLY): 24 min   Charges:   PT Evaluation $PT Eval Moderate Complexity: 1 Mod PT Treatments $Therapeutic Activity: 8-22 mins PT General Charges $$ ACUTE PT VISIT: 1 Visit         Sheran Lawless, PT Acute Rehabilitation Services Office:[phone] 08/07/2023   Elray Mcgregor 08/07/2023, 6:18 PM

## 2023-08-07 NOTE — Progress Notes (Signed)
PROGRESS NOTE  Jill Allison  GEX:[id number] DOB: [birth year] DOA: 08/06/2023 PCP: Mila Palmer, MD   Brief Narrative: Patient is a 62 year old female with history of anxiety who presented with multiple symptoms including fatigue, generalized pain and bodyaches, numbness/tingling on her hands and feet.  She recently had COVID infection about 3 weeks ago.  On presentation, she was hemodynamically stable but mildly hypertensive.  Vitamin B12 was found to be 200.  LP was performed which showed elevated albumin, low suspicion for meningitis/encephalitis.  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  Started on IVIG.  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  Recent history of COVID.  Also had mild weakness in bilateral lower extremities.  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  Neurology consulted.  Started on IVIG.  Plan for 5 days. Acetylcholine receptor, MuSK pending NIF's and vital capacity every 12 hours.Stable Consulted PT and OT  Vitamin B12 deficiency: Supplemented.  Methylmalonic acid level pending.  Hypertension: Does not take any medication at home.  Noted to be hypertensive here.  Started  on amlodipine         DVT prophylaxis:     Code Status: Full Code  Family Communication: Discussed with husband at bedside on 10/19  Patient status:obs  Patient is from : Home  Anticipated discharge to: Home  Estimated DC date: After completion of IVIG   Consultants: Neurology  Procedures: None  Antimicrobials:  Anti-infectives (From admission, onward)    None       Subjective: Patient seen and examined the bedside today.  Hemodynamically  stable.  Her symptoms remains the same but she appears comfortable.  She complains of tingling, numbness/discomfort on both legs and hands.  No sensory deficits.  No weakness.  No visual problem this morning   Objective: Vitals:   08/06/23 1543 08/06/23 1953 08/06/23 2355 08/07/23 0424  BP: (!) 169/92 (!) 161/89 (!) 174/102 (!) 158/87  Pulse: 88 99 (!) 101 98  Resp:      Temp: 98.2 F (36.8 C) 98.4 F (36.9 C) 98.6 F (37 C) 98.6 F (37 C)  TempSrc: Oral Oral Oral Oral  SpO2: 93% 94% 94% 94%  Weight:      Height:       No intake or output data in the 24 hours ending 08/07/23 0751 Filed Weights   08/06/23 0722  Weight: 73.5 kg    Examination:  General exam: Overall comfortable, not in distress HEENT: PERRL Respiratory system:  no wheezes or crackles  Cardiovascular system: S1 & S2 heard, RRR.  Gastrointestinal system: Abdomen is nondistended, soft and nontender. Central nervous system: Alert and oriented Extremities: No edema, no clubbing ,no cyanosis Skin: No rashes, no ulcers,no icterus     Data Reviewed: I have personally reviewed following labs and imaging studies  CBC: Recent Labs  Lab 08/06/23 0850 08/07/23 0628  WBC 6.1 5.8  HGB 12.3 11.9*  HCT 39.6 38.7  MCV 83.5 82.0  PLT 330 319   Basic Metabolic Panel: Recent Labs  Lab 08/06/23 0850  NA 141  K 4.2  CL 103  CO2 31  GLUCOSE 103*  BUN 17  CREATININE 0.81  CALCIUM 9.5  MG 2.0     Recent Results (from the past 240 hour(s))  CSF culture w Gram Stain     Status: None (Preliminary result)   Collection Time: 08/06/23  1:30 PM   Specimen: CSF; Cerebrospinal Fluid  Result Value Ref Range Status   Specimen Description   Final    CSF Performed at [facility], [address] [place], [place] [address]    Special Requests   Final    NONE Performed at [facility], [address], [place], [place] [address]    Gram Stain PENDING  Incomplete   Culture PENDING   Incomplete   Report Status PENDING  Incomplete     Radiology Studies: No results found.  Scheduled Meds:  aspirin EC  81 mg Oral Daily   vitamin B-12  1,000 mcg Oral Daily   famotidine  20 mg Oral Daily   PARoxetine  20 mg Oral Daily   sodium chloride flush  3 mL Intravenous Q12H   Continuous Infusions:  Immune Globulin 10%       LOS: 0 days   Burnadette Pop, MD [facility] P10/19/2024, 7:51 AM

## 2023-08-07 NOTE — Evaluation (Signed)
Occupational Therapy Evaluation Patient Details Name: Jill Allison MRN: [id number] DOB: 03/10/1961 Today's Date: 08/07/2023   History of Present Illness 62 y.o. female admitted to [facility] on 08/06/23 due to fatigue, generalized pain and bodyaches, numbness/tingling in hands and feet. 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 Pt started on IVIG on 08/07/23 for a 5 day course. No PMH.   Clinical Impression   Pt admitted for concerns listed above. PTA pt reported that she was completely independent with all ADL's and IADLs', including working and driving. At this time pt presents with severe ataxia, which she believes is worsening. She is unable to coordination/motor plan bringing her finger from her nose to OT's finger within 2 inches. Pt also is having increased difficulty with all mobility, reporting she can't even transfer to the [facility] without 2 people assisting. This session pt was too nauseas to complete further functional mobility. OT recommending skilled OT (>3+hours/day), along with acute OT continuing to follow to address impairments listed below.       If plan is discharge home, recommend the following: Two people to help with walking and/or transfers;A lot of help with bathing/dressing/bathroom;Assistance with cooking/housework;Help with stairs or ramp for entrance    Functional Status Assessment  Patient has had a recent decline in their functional status and demonstrates the ability to make significant improvements in function in a reasonable and predictable amount of time.  Equipment Recommendations  BSC/3in1;Wheelchair (measurements OT);Other (comment) (RW)    Recommendations for Other Services Rehab consult     Precautions / Restrictions Precautions Precautions: Fall Restrictions Weight Bearing Restrictions: No      Mobility Bed Mobility Overal bed mobility: Needs Assistance Bed Mobility: Rolling Rolling: Mod  assist, Used rails         General bed mobility comments: Requiring mod assist to roll towards her R and L side    Transfers                   General transfer comment: deferred due to pt nausea      Balance                                           ADL either performed or assessed with clinical judgement   ADL Overall ADL's : Needs assistance/impaired Eating/Feeding: Moderate assistance   Grooming: Moderate assistance;Sitting   Upper Body Bathing: Moderate assistance;Sitting   Lower Body Bathing: Maximal assistance;Sitting/lateral leans   Upper Body Dressing : Moderate assistance;Sitting   Lower Body Dressing: Maximal assistance;Sitting/lateral leans   Toilet Transfer: Maximal assistance;+2 for physical assistance;+2 for safety/equipment;Squat-pivot   Toileting- Clothing Manipulation and Hygiene: Moderate assistance;Sitting/lateral lean         General ADL Comments: Pt with severe ataxia and weakness, requiring mod to max assist with all ADL's.     Vision Baseline Vision/History: 0 No visual deficits Ability to See in Adequate Light: 0 Adequate Patient Visual Report: No change from baseline Vision Assessment?: No apparent visual deficits     Perception         Praxis         Pertinent Vitals/Pain Pain Assessment Pain Assessment: 0-10 Pain Score: 5  Pain Location: hands and feet Pain Descriptors / Indicators: Aching, Discomfort, Tingling Pain Intervention(s): Limited activity within patient's tolerance, Monitored during session, Repositioned     Extremity/Trunk Assessment Upper Extremity Assessment Upper Extremity  Assessment: RUE deficits/detail;LUE deficits/detail RUE Deficits / Details: Ataxic and weak - 3/5 overall RUE Sensation: decreased light touch;decreased proprioception RUE Coordination: decreased fine motor;decreased gross motor LUE Deficits / Details: Ataxic and weak - 3/5 in all mobility LUE Sensation:  decreased light touch;decreased proprioception LUE Coordination: decreased fine motor;decreased gross motor   Lower Extremity Assessment Lower Extremity Assessment: Defer to PT evaluation   Cervical / Trunk Assessment Cervical / Trunk Assessment: Normal   Communication Communication Communication: No apparent difficulties   Cognition Arousal: Alert Behavior During Therapy: WFL for tasks assessed/performed Overall Cognitive Status: Within Functional Limits for tasks assessed                                       General Comments  VSS on RA    Exercises     Shoulder Instructions      Home Living Family/patient expects to be discharged to:: Private residence Living Arrangements: Spouse/significant other Available Help at Discharge: Family Type of Home: House Home Access: Ramped entrance     Home Layout: One level     Bathroom Shower/Tub: Producer, television/film/video: Standard Bathroom Accessibility: Yes How Accessible: Accessible via walker Home Equipment: Shower seat - built in          Prior Functioning/Environment Prior Level of Function : Independent/Modified Independent;Working/employed;Driving             Mobility Comments: No DME ADLs Comments: Indep        OT Problem List: Decreased strength;Decreased range of motion;Decreased activity tolerance;Impaired balance (sitting and/or standing);Decreased coordination;Impaired sensation;Impaired UE functional use;Pain      OT Treatment/Interventions: Self-care/ADL training;Therapeutic exercise;Neuromuscular education;Energy conservation;DME and/or AE instruction;Therapeutic activities;Patient/family education;Balance training    OT Goals(Current goals can be found in the care plan section) Acute Rehab OT Goals Patient Stated Goal: To get her independence back OT Goal Formulation: With patient/family Time For Goal Achievement: 08/21/23 Potential to Achieve Goals: Good ADL Goals Pt  Will Perform Grooming: with set-up;sitting Pt Will Perform Upper Body Bathing: with supervision;sitting Pt Will Perform Lower Body Bathing: with contact guard assist;sitting/lateral leans;sit to/from stand Pt Will Perform Upper Body Dressing: with supervision;sitting Pt Will Perform Lower Body Dressing: with contact guard assist;sitting/lateral leans;sit to/from stand Pt Will Transfer to Toilet: with contact guard assist;ambulating Pt Will Perform Toileting - Clothing Manipulation and hygiene: with contact guard assist;sitting/lateral leans;sit to/from stand  OT Frequency: Min 2X/week    Co-evaluation              AM-PAC OT "6 Clicks" Daily Activity     Outcome Measure Help from another person eating meals?: A Lot Help from another person taking care of personal grooming?: A Lot Help from another person toileting, which includes using toliet, bedpan, or urinal?: A Lot Help from another person bathing (including washing, rinsing, drying)?: A Lot Help from another person to put on and taking off regular upper body clothing?: A Lot Help from another person to put on and taking off regular lower body clothing?: A Lot 6 Click Score: 12   End of Session Nurse Communication: Mobility status  Activity Tolerance: Other (comment) (Limited by nausea) Patient left: in bed;with call bell/phone within reach;with family/visitor present  OT Visit Diagnosis: Unsteadiness on feet (R26.81);Other abnormalities of gait and mobility (R26.89);Muscle weakness (generalized) (M62.81);Other symptoms and signs involving the nervous system (R29.898)  Time: 4098-1191 OT Time Calculation (min): 10 min Charges:  OT General Charges $OT Visit: 1 Visit OT Evaluation $OT Eval Moderate Complexity: 1 Mod  Wojciech Willetts Bing Plume, OTR/L [facility]  Udell Mazzocco Elane Bing Plume 08/07/2023, 4:15 PM

## 2023-08-07 NOTE — Progress Notes (Signed)
Patient completed NIF and VC with excellent effort.  NIF x 3 > 40 VC x 2 3.28L

## 2023-08-07 NOTE — Consult Note (Signed)
NEUROLOGY CONSULT NOTE   Date of service: August 07, 2023 Patient Name: JERUSALEM INGALSBE MRN:  [id number] DOB:  [birth year] Chief Complaint: "generalized weakness" Requesting Provider: Venora Maples, MD  History of Present Illness  LETHIA HOLLWAY is a 62 y.o. female with recent COVID 19 3weeks ago, GERD, osteoporosis, who presents with bilateral lower extremity pins and needle sensation along with poor coordination.  She reports that she got COVID 3 weeks ago and has been recovering from it pretty well.  She still has some respiratory symptoms including some postnasal dripping, hoarse voice and cough.  She reports that on Tuesday, she woke up with pins and needle sensation in bilateral feet affecting both the top and the bottom of her feet.  The next day, she woke up with similar feeling in her hands in both the anterior and the posterior aspects of her hands and her fingertips but not as bad.  She reports that on Thursday, she was having some trouble with her eyes but then also the pain in her feet was worse and now she was having trouble walking and she felt very incoordinated like a noodle.  She saw her PCP but since this was getting worse, she came into the ED.  In the ED, she had workup with her chemistry and CBC essentially nonrevealing.  She underwent LP which demonstrated elevated protein consistent with albumin no cytologic dissociation.  Case was discussed with neurology with plans to admit her to [facility] for in person neurology evaluation for concerns for Guillain-Barr syndrome.  She denies any prior similar presentations.  She eats a good mix of meat, vegetables and dairy.  She denies any new medications recently except for short course of amoxicillin that she was given.  She denies any fevers or headaches.   ROS  Comprehensive ROS performed and pertinent positives documented in HPI  Past History  History reviewed. No pertinent past medical history.  History  reviewed. No pertinent surgical history.  Family History: History reviewed. No pertinent family history.  Social History  reports that she does not drink alcohol and does not use drugs. No history on file for tobacco use.  No Known Allergies  Medications   Current Facility-Administered Medications:    acetaminophen (TYLENOL) tablet 650 mg, 650 mg, Oral, Q6H PRN **OR** acetaminophen (TYLENOL) suppository 650 mg, 650 mg, Rectal, Q6H PRN, Venora Maples, MD   aspirin EC tablet 81 mg, 81 mg, Oral, Daily, Venora Maples, MD, 81 mg at 08/06/23 2142   cyanocobalamin (VITAMIN B12) tablet 1,000 mcg, 1,000 mcg, Oral, Daily, Venora Maples, MD, 1,000 mcg at 08/06/23 2142   famotidine (PEPCID) tablet 20 mg, 20 mg, Oral, Daily, Venora Maples, MD, 20 mg at 08/06/23 2142   ondansetron (ZOFRAN) tablet 4 mg, 4 mg, Oral, Q6H PRN **OR** ondansetron (ZOFRAN) injection 4 mg, 4 mg, Intravenous, Q6H PRN, Venora Maples, MD   oxyCODONE (Oxy IR/ROXICODONE) immediate release tablet 5 mg, 5 mg, Oral, Q4H PRN, Venora Maples, MD   PARoxetine (PAXIL) tablet 20 mg, 20 mg, Oral, Daily, Venora Maples, MD, 20 mg at 08/06/23 2139   polyethylene glycol (MIRALAX / GLYCOLAX) packet 17 g, 17 g, Oral, Daily PRN, Venora Maples, MD   sodium chloride flush (NS) 0.9 % injection 3 mL, 3 mL, Intravenous, Q12H, Venora Maples, MD, 3 mL at 08/06/23 2144   traZODone (DESYREL) tablet 50 mg, 50 mg, Oral, QHS PRN, Venora Maples, MD  Vitals  Vitals:   08/06/23 1113 08/06/23 1543 08/06/23 1953 08/06/23 2355  BP:  (!) 169/92 (!) 161/89 (!) 174/102  Pulse:  88 99 (!) 101  Resp:      Temp: 98.1 F (36.7 C) 98.2 F (36.8 C) 98.4 F (36.9 C) 98.6 F (37 C)  TempSrc: Oral Oral Oral Oral  SpO2:  93% 94% 94%  Weight:      Height:        Body mass index is 26.15 kg/m.  Physical Exam   Constitutional: Appears well-developed and well-nourished.  Psych: Affect appropriate to situation.   Eyes: No scleral injection.  HENT: No OP obstruction.  Head: Normocephalic.  Cardiovascular: Normal rate and regular rhythm.  Respiratory: Effort normal, non-labored breathing.  GI: Soft.  No distension. There is no tenderness.  Skin: WDI.    Neurologic Examination  Mental status/Cognition: Alert, oriented to self, place, month and year, good attention.  Speech/language: Fluent, comprehension intact, object naming intact, repetition intact.  Cranial nerves:   CN II Pupils equal and reactive to light, no VF deficits    CN III,IV,VI EOM intact with mild ophthalmoplegia, no gaze preference or deviation, no nystagmus, bilateral ptosis with upper eyelid encroaching on pupils bilaterally   CN V normal sensation in V1, V2, and V3 segments bilaterally    CN VII no asymmetry, no nasolabial fold flattening    CN VIII normal hearing to speech    CN IX & X normal palatal elevation, no uvular deviation    CN XI 5/5 head turn and 5/5 shoulder shrug bilaterally    CN XII midline tongue protrusion    Motor:  Muscle bulk: normal, tone normal, pronator drift none tremor none Neck flexion 4/5. Neck extension 4+/5 Mvmt Root Nerve  Muscle Right Left Comments  SA C5/6 Ax Deltoid 5 5   EF C5/6 Mc Biceps 5 5   EE C6/7/8 Rad Triceps 5 5   WF C6/7 Med FCR     WE C7/8 PIN ECU     F Ab C8/T1 U ADM/FDI 4+ 4+   HF L1/2/3 Fem Illopsoas 4 4   KE L2/3/4 Fem Quad 5 5   DF L4/5 D Peron Tib Ant 5 5   PF S1/2 Tibial Grc/Sol 5 5    Reflexes:  Right Left Comments  Pectoralis      Biceps (C5/6) 0 0   Brachioradialis (C5/6) 1 1    Triceps (C6/7) 0 0    Patellar (L3/4) 0 0    Achilles (S1) 0 0    Hoffman      Plantar     Jaw jerk    Sensation:  Light touch Mildly decreased in bilateral feet and bilateral hands.   Pin prick Intact throughout   Temperature Intact throughout   Vibration Intact throughout  Proprioception    Coordination/Complex Motor:  - Finger to Nose with significant ataxia  bilaterally - Heel to shin with significant ataxia bilaterally - Rapid alternating movement are slow and incoordinated - Gait: Deferred gait and station evaluation for patient's safety.  Labs   CBC:  Recent Labs  Lab 08/06/23 0850  WBC 6.1  HGB 12.3  HCT 39.6  MCV 83.5  PLT 330    Basic Metabolic Panel:  Lab Results  Component Value Date   NA 141 08/06/2023   K 4.2 08/06/2023   CO2 31 08/06/2023   GLUCOSE 103 (H) 08/06/2023   BUN 17 08/06/2023   CREATININE 0.81 08/06/2023   CALCIUM 9.5 08/06/2023  GFRNONAA >60 08/06/2023   GFRAA >90 04/01/2012   Lipid Panel: No results found for: "LDLCALC" HgbA1c:  Lab Results  Component Value Date   HGBA1C 6.0 (H) 08/06/2023   Urine Drug Screen: No results found for: "LABOPIA", "COCAINSCRNUR", "LABBENZ", "AMPHETMU", "THCU", "LABBARB"  Alcohol Level No results found for: "ETH" INR No results found for: "INR" APTT No results found for: "APTT" AED levels: No results found for: "PHENYTOIN", "ZONISAMIDE", "LAMOTRIGINE", "LEVETIRACETA"  CSF studies: No pleocytosis. Mildly elevated protein to 50. Meningitis/encephalitis bio fire panel is negative Vitamin B12 levels are low normal at 200  Impression   ANGELLENA TYBURSKI is a 62 y.o. female with COVID-pneumonia 3 weeks ago and recovering from that who presents with triad of ataxia, areflexia, mild ophthalmoplegia with ptosis along with length dependent stocking and glove distribution paresthesias and mild weakness in bilateral lower extremities and weakness of neck flexion and extension.  Her presentation is most concerning for AIDP and my suspicion is that she likely has [employer] variant.  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  I discussed treatment options with patient including IVIG and plasma exchange and discuss mode of administration, risks, benefits and alternatives.  I discussed that they are both equivalent.  Patient opted  to go with IVIG at this time.  Recommendations  - IVIG 0.4g/Kg every 24 hours x 5 doses - Every 12 hours NIFs and VC specifically due to neck flexion weakness. Can be spaced out if stable - cardiac monitor to evaluate for any potential autonomic dysfunction. - outpatient follow up with either Jacquelyne Balint or Nita Sickle with [facility] Neurology for EMG/NCS. ______________________________________________________________________  Welton Flakes

## 2023-08-07 NOTE — Progress Notes (Signed)
NIF > 40 VC 3.27 good effort

## 2023-08-08 DIAGNOSIS — R270 Ataxia, unspecified: Secondary | ICD-10-CM

## 2023-08-08 MED ORDER — ENOXAPARIN SODIUM 40 MG/0.4ML IJ SOSY
40.0000 mg | PREFILLED_SYRINGE | INTRAMUSCULAR | Status: DC
  Administered 2023-08-08 – 2023-08-11 (×4): 40 mg via SUBCUTANEOUS
  Filled 2023-08-08 (×5): qty 0.4

## 2023-08-08 MED ORDER — LOSARTAN POTASSIUM 50 MG PO TABS
50.0000 mg | ORAL_TABLET | Freq: Every day | ORAL | Status: DC
  Administered 2023-08-08 – 2023-08-12 (×5): 50 mg via ORAL
  Filled 2023-08-08 (×5): qty 1

## 2023-08-08 MED ORDER — AMLODIPINE BESYLATE 10 MG PO TABS
10.0000 mg | ORAL_TABLET | Freq: Every day | ORAL | Status: DC
  Administered 2023-08-08 – 2023-08-12 (×5): 10 mg via ORAL
  Filled 2023-08-08 (×5): qty 1

## 2023-08-08 NOTE — Progress Notes (Addendum)
PHARMACY - ANTICOAGULATION CONSULT NOTE  Pharmacy Consult for enoxaparin Indication: VTE prophylaxis  No Known Allergies  Patient Measurements: Height: 5\' 6"  (167.6 cm) Weight: 73.5 kg (162 lb) IBW/kg (Calculated) : 59.3   Vital Signs: Temp: 97.7 F (36.5 C) (10/20 0849) Temp Source: Oral (10/20 0849) BP: 171/85 (10/20 0849) Pulse Rate: 99 (10/20 0849)  Labs: Recent Labs    08/06/23 0850 08/07/23 0628  HGB 12.3 11.9*  HCT 39.6 38.7  PLT 330 319  CREATININE 0.81 0.73    Estimated Creatinine Clearance: 74.8 mL/min (by C-G formula based on SCr of 0.73 mg/dL).   Medical History: History reviewed. No pertinent past medical history.  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 Pharmacy consulted for dosing lovenox for DVT prophylaxis.   Patient weights 73.5 kg, Scr stable at 0.73 mg/dL, CrCl ~24 mL/min. CBC stable (hgb 11.9, Plt 319).   Goal of Therapy:  Monitor platelets by anticoagulation protocol: Yes   Plan:  Lovenox 40 mg subcutaneously every 24 hours Monitor renal function, CBC, and s/sx of bleeding  Pharmacy signing off as patient has stable weight and renal function.   Enos Fling, PharmD PGY-1 Acute Care Pharmacy Resident 08/08/2023 10:50 AM

## 2023-08-08 NOTE — Progress Notes (Signed)
NIF -40, VC 2.9L with good patient effort.

## 2023-08-08 NOTE — Plan of Care (Signed)
  Problem: Education: Goal: Knowledge of General Education information will improve Description: Including pain rating scale, medication(s)/side effects and non-pharmacologic comfort measures 08/08/2023 0526 by Scheryl Darter, LPN Outcome: Progressing 08/08/2023 0525 by Scheryl Darter, LPN Outcome: Progressing

## 2023-08-08 NOTE — Progress Notes (Addendum)
NEUROLOGY CONSULT FOLLOW UP NOTE   Date of service: August 08, 2023 Patient Name: Jill Allison MRN:  [id number] DOB:  1961/03/15  HPI  Jill Allison is a 62 y.o. female with recent COVID 19 3weeks ago, GERD, osteoporosis, who presents with bilateral lower extremity pins and needle sensation along with poor coordination.   She reports that she got COVID 3 weeks ago and has been recovering from it pretty well.  She still has some respiratory symptoms including some postnasal dripping, hoarse voice and cough.  She reports that on Tuesday, she woke up with pins and needle sensation in bilateral feet affecting both the top and the bottom of her feet.  The next day, she woke up with similar feeling in her hands in both the anterior and the posterior aspects of her hands and her fingertips but not as bad.  She reports that on Thursday, she was having some trouble with her eyes but then also the pain in her feet was worse and now she was having trouble walking and she felt very incoordinated like a noodle.  She saw her PCP but since this was getting worse, she came into the ED.   In the ED, she had workup with her chemistry and CBC essentially nonrevealing.  She underwent LP which demonstrated elevated protein consistent with albumin no cytologic dissociation.  Case was discussed with neurology with plans to admit her to [facility] for in person neurology evaluation for concerns for Guillain-Barr syndrome.   She denies any prior similar presentations.  She eats a good mix of meat, vegetables and dairy.  She denies any new medications recently except for short course of amoxicillin that she was given.  She denies any fevers or headaches   Interval Hx/subjective   No acute changes. Tolerating IVIG well Reports mild improvement in coordination with finger movements  Vitals   Vitals:   08/07/23 1537 08/07/23 1956 08/08/23 0505 08/08/23 0849  BP: (!) 166/78 (!) 171/91 (!) 166/81 (!)  171/85  Pulse: 98 98 92 99  Resp:  17 16 17   Temp: 98 F (36.7 C) 98.3 F (36.8 C) 98 F (36.7 C) 97.7 F (36.5 C)  TempSrc: Oral Oral Oral Oral  SpO2: 90% 96% 95% 94%  Weight:      Height:         Body mass index is 26.15 kg/m.  Physical Exam  General: Awake alert in no distress HEENT: Normocephalic atraumatic Lungs: Clear Cardiovascular: Regular rhythm Abdomen nondistended nontender Neurological exam Awake alert oriented x 3 No dysarthria Aphasia Cranial nerve examination: Pupils equal round reactive to light, extraocular movements intact, no diplopia, no nystagmus, ptosis observed worse on right than left with no worsening of ptosis on sustained upward gaze, face grossly symmetric with no nasolabial fold flattening, tongue and palate midline. Motor examination with about 4/5 strength in the bilateral upper extremities.  Nearly symmetric 5/5 strength in bilateral lower extremities Sensation diminished in a glove and stocking type pattern to all modalities including vibration Coordination: Significant ataxia bilaterally-feels more like sensory ataxia. DTRs are completely absent, left toes downgoing, right equal vocal. Neck flexion and extension is about 4+/5.  NIF and FVC is normal.  Labs and Diagnostic Imaging   CBC:  Recent Labs  Lab 08/06/23 0850 08/07/23 0628  WBC 6.1 5.8  HGB 12.3 11.9*  HCT 39.6 38.7  MCV 83.5 82.0  PLT 330 319    Basic Metabolic Panel:  Lab Results  Component Value  Date   NA 139 08/07/2023   K 4.1 08/07/2023   CO2 28 08/07/2023   GLUCOSE 99 08/07/2023   BUN 16 08/07/2023   CREATININE 0.73 08/07/2023   CALCIUM 9.3 08/07/2023   GFRNONAA >60 08/07/2023   GFRAA >90 04/01/2012   HgbA1c:  Lab Results  Component Value Date   HGBA1C 6.0 (H) 08/06/2023    Recommendations   Jill Allison is a 62 y.o. female past history of COVID-pneumonia 3 weeks ago recovering from that presenting with triad of ataxia areflexia and initially  what was seen is mild ophthalmoplegia with ptosis along with length-dependent glove and stocking paresthesias and neuropathy with mild weakness all over and neck flexor and extensor weakness-suspicion for AIDP, vs pharyngeal-cervical-brachial variant of  GBS due to CSF with elevated protein to 50, started on IVIG. Is doing relatively well today.  Mild upper extremity weakness but not much of a weakness in lower extremities.  Still remains areflexic.  Does not have frank ophthalmoplegia but does have bilateral ptosis right worse than left Rather atypical presentation but likely demyelinating polyneuropathy versus neuromuscular junction etiology.  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  Neck flexor and extensor strength is good on exam and she does not exhibit any shortness of breath. Continue NIF and FVC for 1 more day and then can consider discontinuing. Myasthenia labs have been ordered-will need outpatient follow-up. Gq1b was not ordered. She now has received IVIG, so I am not sure if there is utility in sending it out now, but will order. She will need outpatient follow-up with neuromuscular neurology-Dr. Allena Katz or Dr. Loleta Chance at [facility] neurology for evaluation and possible EMG/NCS about 4 to 6 weeks after discharge. Plan discussed with the patient and husband at bedside. Plan was relayed to Dr. Renford Dills  -- Milon Dikes, MD Neurologist [facility]

## 2023-08-08 NOTE — Plan of Care (Signed)
  Problem: Education: Goal: Knowledge of General Education information will improve Description: Including pain rating scale, medication(s)/side effects and non-pharmacologic comfort measures Outcome: Progressing   Problem: Health Behavior/Discharge Planning: Goal: Ability to manage health-related needs will improve Outcome: Progressing   Problem: Activity: Goal: Risk for activity intolerance will decrease Outcome: Progressing   Problem: Pain Managment: Goal: General experience of comfort will improve Outcome: Progressing   Problem: Skin Integrity: Goal: Risk for impaired skin integrity will decrease Outcome: Progressing   

## 2023-08-08 NOTE — Progress Notes (Signed)
PROGRESS NOTE  Jill Allison  WJX:[id number] DOB: 1961/04/26 DOA: 08/06/2023 PCP: Mila Palmer, MD   Brief Narrative: Patient is a 62 year old female with history of anxiety who presented with multiple symptoms including fatigue, generalized pain and bodyaches, numbness/tingling on her hands and feet.  She recently had COVID infection about 3 weeks ago.  On presentation, she was hemodynamically stable but mildly hypertensive.  Vitamin B12 was found to be 200.  LP was performed which showed elevated albumin, low suspicion for meningitis/encephalitis.  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  Started on IVIG,day 2/5.  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  Recent history of COVID.  Also had mild weakness in bilateral lower extremities.  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  Neurology consulted.  Started on IVIG.  Plan for 5 days.Day 2/5 Acetylcholine receptor, MuSK pending NIF's and vital capacity every 12 hours.Stable Consulted PT and OT, recommendation is AIR.  Vitamin B12 deficiency: Supplemented.  Methylmalonic acid level pending.  Hypertension: Does not take any medication at home.  Noted to be hypertensive here.  Started  on amlodipine         DVT prophylaxis:Lovenox     Code Status: Full Code  Family Communication: Discussed with husband at bedside on 10/19  Patient status:obs  Patient is from : Home  Anticipated discharge to: Home  Estimated DC date: After completion of IVIG.  Can go earlier and finish the dose of IVIG if goes to CIR   Consultants: Neurology  Procedures:  None  Antimicrobials:  Anti-infectives (From admission, onward)    None       Subjective: Patient seen and examined at bedside today.  Hemodynamically stable.  Overall comfortable.  Continues to have numbness, tingling on bilateral hands and feet today.   Objective: Vitals:   08/07/23 1537 08/07/23 1956 08/08/23 0505 08/08/23 0849  BP: (!) 166/78 (!) 171/91 (!) 166/81 (!) 171/85  Pulse: 98 98 92 99  Resp:  17 16 17   Temp: 98 F (36.7 C) 98.3 F (36.8 C) 98 F (36.7 C) 97.7 F (36.5 C)  TempSrc: Oral Oral Oral Oral  SpO2: 90% 96% 95% 94%  Weight:      Height:        Intake/Output Summary (Last 24 hours) at 08/08/2023 1014 Last data filed at 08/07/2023 2109 Gross per 24 hour  Intake 203 ml  Output --  Net 203 ml   Filed Weights   08/06/23 0722  Weight: 73.5 kg    Examination:  General exam: Overall comfortable, not in distress HEENT: PERRL Respiratory system:  no wheezes or crackles  Cardiovascular system: S1 & S2 heard, RRR.  Gastrointestinal system: Abdomen is nondistended, soft and nontender. Central nervous system: Alert and oriented Extremities: No edema, no clubbing ,no cyanosis Skin: No rashes, no ulcers,no icterus     Data Reviewed: I have personally reviewed following labs and imaging studies  CBC: Recent Labs  Lab 08/06/23 0850 08/07/23 0628  WBC 6.1 5.8  HGB 12.3 11.9*  HCT 39.6 38.7  MCV 83.5 82.0  PLT 330 319   Basic Metabolic Panel: Recent Labs  Lab 08/06/23 0850 08/07/23 0628  NA 141 139  K 4.2 4.1  CL  103 103  CO2 31 28  GLUCOSE 103* 99  BUN 17 16  CREATININE 0.81 0.73  CALCIUM 9.5 9.3  MG 2.0  --      Recent Results (from the past 240 hour(s))  CSF culture w Gram Stain     Status: None (Preliminary result)   Collection Time: 08/06/23  1:30 PM   Specimen: CSF; Cerebrospinal Fluid  Result Value Ref Range Status   Specimen Description   Final    CSF Performed at [facility], [address],  [place], [place] [address]    Special Requests   Final    NONE Performed at [facility], [address] [place], [place] [address]    Gram Stain NO WBC SEEN NO ORGANISMS SEEN   Final   Culture   Final    NO GROWTH 2 DAYS Performed at [facility], [address], [place], [place] [address]    Report Status PENDING  Incomplete     Radiology Studies: No results found.  Scheduled Meds:  amLODipine  10 mg Oral Daily   aspirin EC  81 mg Oral Daily   vitamin B-12  1,000 mcg Oral Daily   famotidine  20 mg Oral Daily   PARoxetine  20 mg Oral Daily   sodium chloride flush  3 mL Intravenous Q12H   Continuous Infusions:  Immune Globulin 10% Stopped (08/07/23 1515)     LOS: 1 day   Burnadette Pop, MD [facility] P10/20/2024, 10:14 AM

## 2023-08-08 NOTE — Progress Notes (Signed)
FVC 3.3L NIF >-40 Good pt effort

## 2023-08-09 DIAGNOSIS — R270 Ataxia, unspecified: Secondary | ICD-10-CM | POA: Diagnosis not present

## 2023-08-09 LAB — ACETYLCHOLINE RECEPTOR, BINDING: Acety choline binding ab: <0.03 nmol/L (ref 0.00–0.24)

## 2023-08-09 MED FILL — Immune Globulin (Human) IV Soln 20 GM/200ML: INTRAVENOUS | Qty: 200 | Status: AC

## 2023-08-09 MED FILL — Immune Globulin (Human) IV Soln 5 GM/50ML: INTRAVENOUS | Qty: 50 | Status: AC

## 2023-08-09 NOTE — Plan of Care (Signed)

## 2023-08-09 NOTE — Progress Notes (Signed)
Physical Therapy Treatment Patient Details Name: Jill Allison MRN: [id number] DOB: [birth year] Today's Date: 08/09/2023   History of Present Illness 62 y.o. female admitted to [facility] on 08/06/23 due to fatigue, generalized pain and bodyaches, numbness/tingling in hands and feet. 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 Pt started on IVIG on 08/07/23 for a 5 day course. No PMH.    PT Comments  Pt greeted resting in bed and agreeable to session with continued progress towards acute goals. Pt able to come to sitting EOB with min A and maintain sitting with grossly CGA for safety. Pt able to come to stand x3 in stedy frame with mod-max A to achieve upright standing. Pt continues to demonstrate knee/hip hyperextension and significantly flexed trunk as pt with UE weakness and unable to elevate trunk from stedy bar. Pt performing seated LE therex with focus on returning foot to target for improved proprioception. Current plan remains appropriate to address deficits and maximize functional independence and decrease caregiver burden. Pt continues to benefit from skilled PT services to progress toward functional mobility goals.      If plan is discharge home, recommend the following: Two people to help with bathing/dressing/bathroom;Two people to help with walking and/or transfers;Assist for transportation;Assistance with cooking/housework;Help with stairs or ramp for entrance   Can travel by private vehicle        Equipment Recommendations  Other (comment) (TBA)    Recommendations for Other Services       Precautions / Restrictions Precautions Precautions: Fall Precaution Comments: ataxia Restrictions Weight Bearing Restrictions: No     Mobility  Bed Mobility Overal bed mobility: Needs Assistance Bed Mobility: Rolling, Sidelying to Sit Rolling: Used rails, Min assist Sidelying to sit: Used rails, Mod assist       General bed  mobility comments: cues for technique to use UE's and A for lifting trunk, moving legs off EOB and scooting to EOB    Transfers Overall transfer level: Needs assistance Equipment used: Ambulation equipment used Transfers: Sit to/from Stand Sit to Stand: Max assist, Mod assist, From elevated surface           General transfer comment: standing from elevated EOB, pt with poor engagement of UEs on stedy bar, able to maintain grasp. max A to initiate power up and mod A to complete full rise, pt with strongly flexed trunk, unable to elevate trunk with UEs. Transfer via Lift Equipment: Stedy  Ambulation/Gait               General Gait Details: unable   Comptroller Bed    Modified Rankin (Stroke Patients Only)       Balance Overall balance assessment: Needs assistance Sitting-balance support: Feet supported Sitting balance-Leahy Scale: Poor Sitting balance - Comments: with hands in lap and flexed posture sitting with S then with any dynamic movement noted ataxia and LOB with min to mod A to recover   Standing balance support: Bilateral upper extremity supported Standing balance-Leahy Scale: Zero Standing balance comment: mod A and heavy UE support for standing                            Cognition Arousal: Alert Behavior During Therapy: WFL for tasks assessed/performed Overall Cognitive Status: Within Functional Limits for tasks assessed  Exercises General Exercises - Lower Extremity Long Arc Quad: AROM, Right, Left, 10 reps, Seated Hip Flexion/Marching: AROM, Right, Left, 10 reps, Seated    General Comments General comments (skin integrity, edema, etc.): VSS on RA      Pertinent Vitals/Pain Pain Assessment Pain Assessment: Faces Faces Pain Scale: Hurts little more Pain Location: hands and feet Pain Descriptors / Indicators: Tingling, Pins and  needles Pain Intervention(s): Monitored during session, Limited activity within patient's tolerance    Home Living                          Prior Function            PT Goals (current goals can now be found in the care plan section) Acute Rehab PT Goals Patient Stated Goal: return home; more independent PT Goal Formulation: With patient/family Time For Goal Achievement: 08/20/23 Progress towards PT goals: Progressing toward goals    Frequency    Min 1X/week      PT Plan      Co-evaluation              AM-PAC PT "6 Clicks" Mobility   Outcome Measure  Help needed turning from your back to your side while in a flat bed without using bedrails?: A Lot Help needed moving from lying on your back to sitting on the side of a flat bed without using bedrails?: Total Help needed moving to and from a bed to a chair (including a wheelchair)?: Total Help needed standing up from a chair using your arms (e.g., wheelchair or bedside chair)?: Total Help needed to walk in hospital room?: Total Help needed climbing 3-5 steps with a railing? : Total 6 Click Score: 7    End of Session Equipment Utilized During Treatment: Gait belt Activity Tolerance: Patient tolerated treatment well Patient left: in bed;with call bell/phone within reach;with bed alarm set;with nursing/sitter in room Nurse Communication: Mobility status PT Visit Diagnosis: Other abnormalities of gait and mobility (R26.89);Muscle weakness (generalized) (M62.81);Ataxic gait (R26.0);Other symptoms and signs involving the nervous system (R29.898)     Time: 6962-9528 PT Time Calculation (min) (ACUTE ONLY): 23 min  Charges:    $Therapeutic Activity: 23-37 mins PT General Charges $$ ACUTE PT VISIT: 1 Visit                     Ivannia Willhelm R. PTA Acute Rehabilitation Services Office: [phone]   Catalina Antigua 08/09/2023, 12:36 PM

## 2023-08-09 NOTE — Progress Notes (Signed)
NEUROLOGY PROGRESS NOTE   Date of service: August 09, 2023 Patient Name: Jill Allison MRN:  [id number] DOB:  1961-04-09  HPI  Jill Allison is a 62 y.o. female with recent COVID 19 3weeks ago, GERD, osteoporosis, who presents with bilateral lower extremity pins and needle sensation along with poor coordination.   She reports that she got COVID 3 weeks ago and has been recovering from it pretty well.  She still has some respiratory symptoms including some postnasal dripping, hoarse voice and cough.  She reports that on Tuesday, she woke up with pins and needle sensation in bilateral feet affecting both the top and the bottom of her feet.  The next day, she woke up with similar feeling in her hands in both the anterior and the posterior aspects of her hands and her fingertips but not as bad.  She reports that on Thursday, she was having some trouble with her eyes but then also the pain in her feet was worse and now she was having trouble walking and she felt very incoordinated like a noodle.  She saw her PCP but since this was getting worse, she came into the ED.   In the ED, she had workup with her chemistry and CBC essentially nonrevealing.  She underwent LP which demonstrated elevated protein consistent with albumin no cytologic dissociation.  Case was discussed with neurology with plans to admit her to [facility] for in person neurology evaluation for concerns for Guillain-Barr syndrome.   She denies any prior similar presentations.  She eats a good mix of meat, vegetables and dairy.  She denies any new medications recently except for short course of amoxicillin that she was given.  She denies any fevers or headaches   Interval Hx/subjective   No acute changes. Tolerating IVIG well Reports mild improvement in strength but no improvement in coordination or in sensory disturbances following her 2nd dose of IVIG.  FVC and NIF stable overnight   Vitals   Vitals:   08/08/23 1632  08/08/23 2043 08/09/23 0426 08/09/23 0752  BP: (!) 157/95 (!) 162/93 (!) 142/93 138/81  Pulse: (!) 102 99 97 99  Resp: 17 17 18 18   Temp: 98.2 F (36.8 C) 98.4 F (36.9 C) 98.4 F (36.9 C) 98.1 F (36.7 C)  TempSrc: Oral Oral Oral Oral  SpO2: (!) 88% 95% 95% 94%  Weight:      Height:         Body mass index is 26.15 kg/m.  Physical Exam  General: Awake alert in no distress, laying in hospital bed resting with lights off on exam this morning HEENT: Normocephalic atraumatic Cardiovascular: Extremities warm, well perfused, 2+ pedal pulses bilaterally Abdomen nondistended nontender Neurological exam Awake alert oriented x 3 No dysarthria or aphasia Cranial nerve examination: Pupils equal round reactive to light, EOMI, no diplopia, no nystagmus, mild ptosis observed worse on right than left with no worsening of ptosis on sustained upward gaze, face grossly symmetric with no nasolabial fold flattening, tongue and palate midline. Motor examination with about 4/5 strength in the bilateral upper extremities, 4/5 grip strength, dyscoordination out of prortion to confrontational weakness.  Nearly symmetric 5/5 strength in bilateral lower extremities Sensation diminished in a glove and stocking type pattern to all modalities including vibration distal to knees in bilateral lower extremities and distal to elbows in bilateral upper extremities.  Coordination: Significant ataxia bilaterally seems more like sensory ataxia. DTRs are completely absent, left toes downgoing, right equal vocal. Neck  flexion and extension is about 4+/5.  NIF and FVC is normal. Labs and Diagnostic Imaging   CBC:  Recent Labs  Lab 08/06/23 0850 08/07/23 0628  WBC 6.1 5.8  HGB 12.3 11.9*  HCT 39.6 38.7  MCV 83.5 82.0  PLT 330 319   Basic Metabolic Panel:  Lab Results  Component Value Date   NA 139 08/07/2023   K 4.1 08/07/2023   CO2 28 08/07/2023   GLUCOSE 99 08/07/2023   BUN 16 08/07/2023   CREATININE  0.73 08/07/2023   CALCIUM 9.3 08/07/2023   GFRNONAA >60 08/07/2023   GFRAA >90 04/01/2012   HgbA1c:  Lab Results  Component Value Date   HGBA1C 6.0 (H) 08/06/2023    Recommendations   Jill Allison is a 62 y.o. female past history of COVID-pneumonia 3 weeks ago recovering from that who presented with triad of ataxia, areflexia, and initially what was thought is mild ophthalmoplegia with ptosis along with length-dependent glove and stocking paresthesias and neuropathy with mild weakness all over and neck flexor and extensor weakness-suspicion for AIDP, vs pharyngeal-cervical-brachial variant of  GBS due to CSF with elevated protein to 50, started on IVIG.  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   Recommendations  Continue IVIG for total of 5 doses as recommended in the initial consult. Dose 3/5 to be completed today.  NIF and FVC have been stable.  Neck flexor and extensor strength is good on exam and she does not exhibit any shortness of breath. Can consider reducing NIF and VC frequency to QD Myasthenia labs have been ordered-will need outpatient follow-up. Gq1b was not ordered. She now has received IVIG, so I am not sure if there is utility in sending it out now, but will order. She will need outpatient follow-up with neuromuscular neurology 4 to 6 weeks after discharge  Lanae Boast, AGACNP-BC [facility] [phone]  I have seen the patient reviewed the above note.  She initially had documented reflexes 1+ in the brachioradialis, but currently appears to be completely areflexic.  This coupled with her clinical presentation and albuminocytologic dissociation is most consistent with Guillain-Barr syndrome and she is currently undergoing treatment for this.  Ritta Slot, MD [facility] [phone]  If 7pm- 7am, please page neurology on call as listed in AMION.

## 2023-08-09 NOTE — Progress Notes (Signed)
Inpatient Rehab Admissions Coordinator:    I met with Pt. To discuss potential  [facility] admit. She is interested. Still getting IVIG so I will wait to open case until that is complete and it is clear no other treatments are planned. Pt. States husband will take FMLA after d/c from CIR to care for her. I will confirm this with him.   Megan Salon, MS, CCC-SLP Rehab Admissions Coordinator  [phone] (celll) [phone] (office)

## 2023-08-09 NOTE — Progress Notes (Signed)
PROGRESS NOTE  Jill Allison  WUJ:[id number] DOB: [birth year] DOA: 08/06/2023 PCP: Mila Palmer, MD   Brief Narrative: Patient is a 62 year old female with history of anxiety who presented with multiple symptoms including fatigue, generalized pain and bodyaches, numbness/tingling on her hands and feet.  She recently had COVID infection about 3 weeks ago.  On presentation, she was hemodynamically stable but mildly hypertensive.  Vitamin B12 was found to be 200.  LP was performed which showed elevated albumin, low suspicion for meningitis/encephalitis.  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  Started on IVIG,day 3/5.  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  Recent history of COVID.  Also had mild weakness in bilateral lower extremities.  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  Neurology consulted.  Started on IVIG.  Plan for 5 days.Day 2/5 Acetylcholine receptor, MuSK pending NIF's and vital capacity every 12 hours.Stable Consulted PT and OT, recommendation is AIR. Neurology recommending to follow-up with neuromuscular neurology for possible EMG/NCS  in 4 to 6 weeks  Vitamin B12 deficiency: Supplemented.  Methylmalonic acid level pending.  Hypertension: Does not take any medication at home.  Noted to be hypertensive here.  Started  on amlodipine blood pressure better today        DVT prophylaxis:enoxaparin (LOVENOX) injection 40 mg Start: 08/08/23 1200Lovenox     Code Status: Full Code  Family Communication: Discussed with husband at bedside on 10/19  Patient status:obs  Patient is from :  Home  Anticipated discharge to: Home  Estimated DC date: After completion of IVIG.  Can go earlier and finish the dose of IVIG if goes to CIR   Consultants: Neurology  Procedures: None  Antimicrobials:  Anti-infectives (From admission, onward)    None       Subjective: Patient seen and examined at bedside today.  Lying in bed.  Hemodynamically stable.  Continues to complain of tingling and numbness on bilateral feet and hands.  No focal weakness or sensory deficits  Objective: Vitals:   08/08/23 1632 08/08/23 2043 08/09/23 0426 08/09/23 0752  BP: (!) 157/95 (!) 162/93 (!) 142/93 138/81  Pulse: (!) 102 99 97 99  Resp: 17 17 18 18   Temp: 98.2 F (36.8 C) 98.4 F (36.9 C) 98.4 F (36.9 C) 98.1 F (36.7 C)  TempSrc: Oral Oral Oral Oral  SpO2: (!) 88% 95% 95% 94%  Weight:      Height:        Intake/Output Summary (Last 24 hours) at 08/09/2023 1055 Last data filed at 08/08/2023 1339 Gross per 24 hour  Intake --  Output 500 ml  Net -500 ml   Filed Weights   08/06/23 0722  Weight: 73.5 kg    Examination:  General exam: Overall comfortable, not in distress HEENT: PERRL Respiratory system:  no wheezes or crackles  Cardiovascular system: S1 & S2 heard, RRR.  Gastrointestinal system: Abdomen is nondistended, soft and nontender. Central nervous system: Alert and oriented Extremities: No edema, no clubbing ,no cyanosis Skin: No rashes, no ulcers,no icterus    Data Reviewed: I have personally reviewed following labs and imaging studies  CBC: Recent Labs  Lab 08/06/23 0850 08/07/23 0628  WBC 6.1 5.8  HGB 12.3 11.9*  HCT 39.6 38.7  MCV 83.5 82.0  PLT 330 319   Basic Metabolic Panel: Recent Labs  Lab 08/06/23 0850 08/07/23 0628  NA 141 139  K 4.2 4.1  CL 103 103  CO2 31 28  GLUCOSE 103* 99  BUN 17 16  CREATININE 0.81 0.73  CALCIUM 9.5 9.3  MG 2.0  --      Recent Results (from the past 240 hour(s))  CSF culture w Gram Stain     Status: None  (Preliminary result)   Collection Time: 08/06/23  1:30 PM   Specimen: CSF; Cerebrospinal Fluid  Result Value Ref Range Status   Specimen Description   Final    CSF Performed at [facility], [address], [place], [place] [address]    Special Requests   Final    NONE Performed at [facility], [address] [place], [place] [address]    Gram Stain NO WBC SEEN NO ORGANISMS SEEN   Final   Culture   Final    NO GROWTH 3 DAYS Performed at [facility], [address] [place], [place] [address]    Report Status PENDING  Incomplete     Radiology Studies: No results found.  Scheduled Meds:  amLODipine  10 mg Oral Daily   aspirin EC  81 mg Oral Daily   vitamin B-12  1,000 mcg Oral Daily   enoxaparin (LOVENOX) injection  40 mg Subcutaneous Q24H   famotidine  20 mg Oral Daily   losartan  50 mg Oral Daily   PARoxetine  20 mg Oral Daily   sodium chloride flush  3 mL Intravenous Q12H   Continuous Infusions:  Immune Globulin 10% Stopped (08/08/23 1606)     LOS: 2 days   Burnadette Pop, MD [facility] P10/21/2024, 10:55 AM

## 2023-08-09 NOTE — Plan of Care (Signed)

## 2023-08-09 NOTE — Progress Notes (Signed)
Pt's Negative Inspiratory Force & Vital Capacity as follows: NIF= >-40cmH2O VC= 3.3Liters Pt denies SOB/distress and is VSS on RA at this time.

## 2023-08-10 DIAGNOSIS — R270 Ataxia, unspecified: Secondary | ICD-10-CM | POA: Diagnosis not present

## 2023-08-10 LAB — CSF CULTURE W GRAM STAIN

## 2023-08-10 NOTE — Progress Notes (Signed)
Physical Therapy Treatment Patient Details Name: LELER DRAPER MRN: [id number] DOB: May 24, 1961 Today's Date: 08/10/2023   History of Present Illness 62 y.o. female admitted to [facility] on 08/06/23 due to fatigue, generalized pain and bodyaches, numbness/tingling in hands and feet. 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 Pt started on IVIG on 08/07/23 for a 5 day course. No PMH.    PT Comments  PT pleasant and very willing to participate. Pt with improved ability with bed mobility and continues to require assist for sitting and standing balance due to truncal ataxia. Pt able to brush her hair in sitting with mild UB ataxia during task but significantly more limited with self-feeding. Pt with hip and LB ataxia in standing benefiting from stedy frame or +2 assist with knees blocked. Pt limited by fatigue. Patient will benefit from intensive inpatient follow up therapy, >3 hours/day.         If plan is discharge home, recommend the following: Two people to help with bathing/dressing/bathroom;Two people to help with walking and/or transfers;Assist for transportation;Assistance with cooking/housework;Help with stairs or ramp for entrance   Can travel by private vehicle        Equipment Recommendations  Other (comment) (TBD)    Recommendations for Other Services       Precautions / Restrictions Precautions Precautions: Fall Precaution Comments: ataxia     Mobility  Bed Mobility Overal bed mobility: Needs Assistance Bed Mobility: Supine to Sit     Supine to sit: Contact guard     General bed mobility comments: guarding for safety, pt with ability to clear legs off EOB and elevate trunk from surface    Transfers Overall transfer level: Needs assistance   Transfers: Sit to/from Stand, Bed to chair/wheelchair/BSC Sit to Stand: Max assist, +2 physical assistance, Mod assist           General transfer comment: max +2 to  stand from EOB with bil UE supported on therapists with bil knees blocked, physical assist to rise and control trunk and pelvis to power up. ataxia in standing with max assist to control trunk, weight shift and cues to step RLE into center. Pt fatigues quickly in standing with nausea and return to sitting. Repeated stand with stedy with mod +2 assist needing cues and assist for power up and trunk control. In standing in stedy pt with anterior lean and unable to correct with UB to midline. assist to control descent to chair Transfer via Lift Equipment: Stedy  Ambulation/Gait               General Gait Details: unable   Stairs             Wheelchair Mobility     Tilt Bed    Modified Rankin (Stroke Patients Only)       Balance Overall balance assessment: Needs assistance   Sitting balance-Leahy Scale: Poor Sitting balance - Comments: LUE supported EOB with min assist due to posterior right lean with cues for safety and midline. reliant on UB support   Standing balance support: Bilateral upper extremity supported Standing balance-Leahy Scale: Zero Standing balance comment: mod A and heavy UE support for standing                            Cognition Arousal: Alert Behavior During Therapy: WFL for tasks assessed/performed Overall Cognitive Status: Within Functional Limits for tasks assessed  Exercises General Exercises - Lower Extremity Long Arc Quad: AROM, Seated, Both, 15 reps Heel Raises: AROM, Both, 5 reps, Seated (along shin for control)    General Comments        Pertinent Vitals/Pain Pain Assessment Pain Assessment: No/denies pain    Home Living                          Prior Function            PT Goals (current goals can now be found in the care plan section) Progress towards PT goals: Progressing toward goals    Frequency    Min 1X/week      PT Plan       Co-evaluation              AM-PAC PT "6 Clicks" Mobility   Outcome Measure  Help needed turning from your back to your side while in a flat bed without using bedrails?: A Little Help needed moving from lying on your back to sitting on the side of a flat bed without using bedrails?: A Little Help needed moving to and from a bed to a chair (including a wheelchair)?: Total Help needed standing up from a chair using your arms (e.g., wheelchair or bedside chair)?: Total Help needed to walk in hospital room?: Total Help needed climbing 3-5 steps with a railing? : Total 6 Click Score: 10    End of Session Equipment Utilized During Treatment: Gait belt Activity Tolerance: Patient tolerated treatment well Patient left: with call bell/phone within reach;in chair;with chair alarm set Nurse Communication: Mobility status;Need for lift equipment PT Visit Diagnosis: Other abnormalities of gait and mobility (R26.89);Muscle weakness (generalized) (M62.81);Ataxic gait (R26.0);Other symptoms and signs involving the nervous system (R29.898);Unsteadiness on feet (R26.81)     Time: 1478-2956 PT Time Calculation (min) (ACUTE ONLY): 33 min  Charges:    $Therapeutic Activity: 23-37 mins PT General Charges $$ ACUTE PT VISIT: 1 Visit                     Merryl Hacker, PT Acute Rehabilitation Services Office: [phone]    Enedina Finner Mekala Winger 08/10/2023, 10:41 AM

## 2023-08-10 NOTE — Progress Notes (Signed)
PROGRESS NOTE  Jill Allison  ZOX:[id number] DOB: 07-28-1961 DOA: 08/06/2023 PCP: Mila Palmer, MD   Brief Narrative: Patient is a 62 year old female with history of anxiety who presented with multiple symptoms including fatigue, generalized pain and bodyaches, numbness/tingling on her hands and feet.  She recently had COVID infection about 3 weeks ago.  On presentation, she was hemodynamically stable but mildly hypertensive.  Vitamin B12 was found to be 200.  LP was performed which showed elevated albumin, low suspicion for meningitis/encephalitis.  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  Started on IVIG,day 4/5.  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  Recent history of COVID.  Also had mild weakness in bilateral lower extremities.  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  Neurology consulted.  Started on IVIG.  Plan for 5 days.Day 4/5 Acetylcholine receptor negative, MuSK  Ab pending NIF's and vital capacity every 12 hours.Stable Consulted PT and OT, recommendation is AIR. Neurology recommending to follow-up with neuromuscular neurology for possible EMG/NCS  in 4 to 6 weeks. She does not have focal deficits but has generalized weakness.  Vitamin B12 deficiency: Supplemented.  Methylmalonic acid level pending.  Hypertension: Does not take any medication at home.  Noted to be hypertensive here.  Started  on amlodipine, blood pressure better today        DVT prophylaxis:enoxaparin (LOVENOX) injection 40 mg Start: 08/08/23 1200Lovenox     Code Status: Full Code  Family Communication: Discussed  with husband at bedside on 10/19  Patient status:obs  Patient is from : Home  Anticipated discharge to:CIR  Estimated DC date: After completion of IVIG.     Consultants: Neurology  Procedures: None  Antimicrobials:  Anti-infectives (From admission, onward)    None       Subjective: Patient seen and examined at bedside today.  Hemodynamically stable comfortable.  She feels better today.  Numbness and tingling on both hands and feet slightly better today.  Still unable to walk independently.  Objective: Vitals:   08/09/23 1742 08/09/23 2118 08/10/23 0051 08/10/23 0804  BP: (!) 159/83 (!) 151/89 134/63 137/65  Pulse: (!) 107 (!) 102 99 100  Resp:  17 17 15   Temp:  98.1 F (36.7 C) (!) 97.5 F (36.4 C) 98.3 F (36.8 C)  TempSrc:  Oral Axillary Oral  SpO2: 93% 93% 98% 94%  Weight:      Height:        Intake/Output Summary (Last 24 hours) at 08/10/2023 1059 Last data filed at 08/10/2023 0700 Gross per 24 hour  Intake --  Output 850 ml  Net -850 ml   Filed Weights   08/06/23 0722  Weight: 73.5 kg    Examination:  General exam: Overall comfortable, not in distress HEENT: PERRL Respiratory system:  no wheezes or crackles  Cardiovascular system: S1 & S2 heard, RRR.  Gastrointestinal system: Abdomen is nondistended, soft and nontender. Central nervous system: Alert and oriented Extremities: No edema, no clubbing ,no cyanosis Skin: No rashes, no ulcers,no icterus      Data Reviewed: I have personally reviewed following labs and imaging studies  CBC: Recent Labs  Lab 08/06/23 0850 08/07/23 0628  WBC 6.1 5.8  HGB 12.3 11.9*  HCT  39.6 38.7  MCV 83.5 82.0  PLT 330 319   Basic Metabolic Panel: Recent Labs  Lab 08/06/23 0850 08/07/23 0628  NA 141 139  K 4.2 4.1  CL 103 103  CO2 31 28  GLUCOSE 103* 99  BUN 17 16  CREATININE 0.81 0.73  CALCIUM 9.5 9.3  MG 2.0  --      Recent Results (from the past 240 hour(s))  CSF culture w Gram Stain      Status: None   Collection Time: 08/06/23  1:30 PM   Specimen: CSF; Cerebrospinal Fluid  Result Value Ref Range Status   Specimen Description   Final    CSF Performed at [facility], [address] [place], [place] [address]    Special Requests   Final    NONE Performed at [facility], [address] [place], [place] [address]    Gram Stain NO WBC SEEN NO ORGANISMS SEEN   Final   Culture   Final    NO GROWTH 3 DAYS Performed at [facility], [address] [place], [place] [address]    Report Status 08/10/2023 FINAL  Final     Radiology Studies: No results found.  Scheduled Meds:  amLODipine  10 mg Oral Daily   aspirin EC  81 mg Oral Daily   vitamin B-12  1,000 mcg Oral Daily   enoxaparin (LOVENOX) injection  40 mg Subcutaneous Q24H   famotidine  20 mg Oral Daily   losartan  50 mg Oral Daily   PARoxetine  20 mg Oral Daily   sodium chloride flush  3 mL Intravenous Q12H   Continuous Infusions:  Immune Globulin 10% Stopped (08/09/23 1743)     LOS: 3 days   Burnadette Pop, MD [facility] P10/22/2024, 10:59 AM

## 2023-08-10 NOTE — Progress Notes (Signed)
PMR Admission Coordinator Pre-Admission Assessment  Patient: Jill Allison is an 62 y.o., female MRN: [id number] DOB: [birth year] Height: 5\' 6"  (167.6 cm) Weight: 73.5 kg  Insurance Information HMO:     PPO:      PCP:      IPA:      80/20:      OTHER:  PRIMARY: U[employer] commercial       Policy#: [id number]       Subscriber: Pt.  CM Name:       Phone#: [phone]     Fax#: [phone] Pre-Cert#: [id number] auth for CIR from [employer] with updates to Princess at number listed above on 10/30.  Employer:  Benefits:  Phone #:      Name:  Dolores Hoose Date: 10/19/2022- 10/19/2023 Deductible: $500 ($0 met) OOP Max: $2,500 ($44.52 met) CIR: 80% coverage, 20% co-insurance SNF: 100% coverage, Limited to 90 Days Per Calendar Year combined with [facility]. Outpatient:  $25/visit co-pay/visit, Limited to 60 Visits Per Calendar Year combined Home Health: $25/visit co-pay/visit; Limited to 90 Visits Per Calendar Year. 1 visit equals up to 4 hours of skilled care services. DME: 80% coverage, 20% co-insurance; You can get 1 type of DME or orthotic (including repair/replacement) every 3 Years Providers: in network   SECONDARY:       Policy#:      Phone#:   Artist:       Phone#:   The Data processing manager" for patients in Inpatient Rehabilitation Facilities with attached "Privacy Act Statement-Health Care Records" was provided and verbally reviewed with: n/a   Emergency Contact Information Contact Information     Name Relation Home Work Mobile   Brutus Spouse [phone]  [phone]      Other Contacts   None on File     Current Medical History  Patient Admitting Diagnosis: GBS    History of Present Illness: Jill Allison is a 62 y.o. female with history of anxiety who [facility]  3 weeks after COVID infection on 08/06/23 with ptosis, progressive numbness, ataxia, weakness and was found to have areflexia and  albuminocytologic dissociation. the ED vital signs are notable only for hypertension, otherwise normal.  EKG was unremarkable.  Vitamin B12 was 200.  CBC and CMP were unremarkable.  No imaging was performed.  A lumbar puncture was performed currently results show negative CSF PCR panel for meningitis encephalitis, normal glucose, borderline elevated protein.  Neurology was consulted and recommended transfer to [facility] for in person consultation, she was transferred and admitted for further evaluation 08/06/23. She underwent LP which demonstrated elevated protein consistent with albumin no cytologic dissociation. 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 She completed course 08/11/23. Pt. Was seen by PT/OT and they recommend CIR to assist return to PLOF.       Patient's medical record from [facility]  has been reviewed by the rehabilitation admission coordinator and physician.  Past Medical History  History reviewed. No pertinent past medical history.  Has the patient had major surgery during 100 days prior to admission? Yes  Family History   family history is not on file.  Current Medications  Current Facility-Administered Medications:    acetaminophen (TYLENOL) tablet 650 mg, 650 mg, Oral, Q6H PRN, 650 mg at 08/10/23 2202 **OR** acetaminophen (TYLENOL) suppository 650 mg, 650 mg, Rectal, Q6H PRN, Crissie Reese,  Mary Sella, MD   amLODipine (NORVASC) tablet 10 mg, 10 mg, Oral, Daily, Adhikari, Amrit, MD, 10 mg at 08/12/23 1610   aspirin EC tablet 81 mg, 81 mg, Oral, Daily, Venora Maples, MD, 81 mg at 08/11/23 2024   cyanocobalamin (VITAMIN B12) tablet 1,000 mcg, 1,000 mcg, Oral, Daily, Venora Maples, MD, 1,000 mcg at 08/12/23 0928   enoxaparin (LOVENOX) injection 40 mg, 40 mg, Subcutaneous, Q24H, Arma Heading, RPH, 40 mg at 08/11/23 1539   famotidine  (PEPCID) tablet 20 mg, 20 mg, Oral, Daily, Venora Maples, MD, 20 mg at 08/11/23 2024   losartan (COZAAR) tablet 50 mg, 50 mg, Oral, Daily, Adhikari, Amrit, MD, 50 mg at 08/12/23 0928   ondansetron (ZOFRAN) tablet 4 mg, 4 mg, Oral, Q6H PRN **OR** ondansetron (ZOFRAN) injection 4 mg, 4 mg, Intravenous, Q6H PRN, Venora Maples, MD, 4 mg at 08/11/23 9604   oxyCODONE (Oxy IR/ROXICODONE) immediate release tablet 5 mg, 5 mg, Oral, Q4H PRN, Venora Maples, MD, 5 mg at 08/08/23 5409   PARoxetine (PAXIL) tablet 20 mg, 20 mg, Oral, Daily, Venora Maples, MD, 20 mg at 08/11/23 2024   polyethylene glycol (MIRALAX / GLYCOLAX) packet 17 g, 17 g, Oral, Daily PRN, Venora Maples, MD, 17 g at 08/11/23 2030   sodium chloride flush (NS) 0.9 % injection 3 mL, 3 mL, Intravenous, Q12H, Venora Maples, MD, 3 mL at 08/12/23 0930   traZODone (DESYREL) tablet 50 mg, 50 mg, Oral, QHS PRN, Venora Maples, MD, 50 mg at 08/11/23 2030  Patients Current Diet:  Diet Order             Diet regular Room service appropriate? Yes; Fluid consistency: Thin  Diet effective now                   Precautions / Restrictions Precautions Precautions: Fall Precaution Comments: ataxia Restrictions Weight Bearing Restrictions: No   Has the patient had 2 or more falls or a fall with injury in the past year? Yes  Prior Activity Level Community (5-7x/wk): Pt. was active in the community  Prior Functional Level Self Care: Did the patient need help bathing, dressing, using the toilet or eating? Independent  Indoor Mobility: Did the patient need assistance with walking from room to room (with or without device)? Independent  Stairs: Did the patient need assistance with internal or external stairs (with or without device)? Independent  Functional Cognition: Did the patient need help planning regular tasks such as shopping or remembering to take medications? Independent  Patient Information Are you  of Hispanic, Latino/a,or Spanish origin?: A. No, not of Hispanic, Latino/a, or Spanish origin What is your race?: A. White Do you need or want an interpreter to communicate with a doctor or health care staff?: 0. No  Patient's Response To:  Health Literacy and Transportation Is the patient able to respond to health literacy and transportation needs?: Yes Health Literacy - How often do you need to have someone help you when you read instructions, pamphlets, or other written material from your doctor or pharmacy?: Never In the past 12 months, has lack of transportation kept you from medical appointments or from getting medications?: No In the past 12 months, has lack of transportation kept you from meetings, work, or from getting things needed for daily living?: No  Home Assistive Devices / Equipment Home Equipment: Shower seat - built in  Prior Device Use: Indicate devices/aids used by the patient prior to  current illness, exacerbation or injury? Walker  Current Functional Level Cognition  Overall Cognitive Status: Within Functional Limits for tasks assessed Orientation Level: Oriented X4 General Comments: pt highly motivated    Extremity Assessment (includes Sensation/Coordination)  Upper Extremity Assessment: Generalized weakness, RUE deficits/detail, LUE deficits/detail RUE Deficits / Details: ataxic and weakness RUE Sensation: decreased proprioception RUE Coordination: decreased fine motor, decreased gross motor LUE Deficits / Details: ataxic movements LUE Sensation: decreased proprioception LUE Coordination: decreased fine motor, decreased gross motor  Lower Extremity Assessment: RLE deficits/detail, LLE deficits/detail RLE Deficits / Details: AROM WFL, strength hip flexion 4/5, knee extension 4+/5, ankle DF 4+/5, numbness in feet/tingling/decreased proprioception; ataxic movements with difficulty grading RLE Sensation: decreased proprioception, decreased light touch RLE  Coordination: decreased gross motor, decreased fine motor LLE Deficits / Details: AROM WFL, strength hip flexion 4/5, knee extension 4+/5, ankle DF 4+/5, numbness in feet/tingling/decreased proprioception; ataxic movements with difficulty grading LLE Sensation: decreased light touch, decreased proprioception LLE Coordination: decreased gross motor, decreased fine motor    ADLs  Overall ADL's : Needs assistance/impaired Eating/Feeding: Minimal assistance, Sitting Eating/Feeding Details (indicate cue type and reason): Pt reported decrease in dropping items but does need some assist with BUE such as cutting Grooming: Contact guard assist, Wash/dry hands, Wash/dry face, Sitting, Bed level Upper Body Bathing: Minimal assistance, Sitting, Bed level Lower Body Bathing: Maximal assistance, Sitting/lateral leans Upper Body Dressing : Minimal assistance, Sitting, Bed level Lower Body Dressing: Maximal assistance, Sitting/lateral leans, Cueing for safety, Cueing for sequencing Toilet Transfer: Maximal assistance, +2 for physical assistance, +2 for safety/equipment, Squat-pivot Toileting- Clothing Manipulation and Hygiene: Moderate assistance, Cueing for safety, Cueing for sequencing, Bed level Toileting - Clothing Manipulation Details (indicate cue type and reason): rolling side to side General ADL Comments: Deffered for further sessions    Mobility  Overal bed mobility: Needs Assistance Bed Mobility: Supine to Sit Rolling: Supervision Sidelying to sit: Used rails, Mod assist Supine to sit: Contact guard, HOB elevated, Used rails Sit to supine: Min assist General bed mobility comments: CGA for safety, cues to scoot fully out to EOB    Transfers  Overall transfer level: Needs assistance Equipment used: Rolling walker (2 wheels) Transfers: Sit to/from Stand, Bed to chair/wheelchair/BSC Sit to Stand: Min assist Bed to/from chair/wheelchair/BSC transfer type:: Step pivot Step pivot transfers: Mod  assist Transfer via Lift Equipment: [employer] transfer comment: min A to power up to stand with RW support from EOB x2 and recliner, mod A to step pivot EOB to chair with x1 LOB needing heavy mod to correct    Ambulation / Gait / Stairs / Wheelchair Mobility  Ambulation/Gait Ambulation/Gait assistance: Mod assist Gait Distance (Feet): 3 Feet Assistive device: Rolling walker (2 wheels) Gait Pattern/deviations: Step-to pattern, Ataxic General Gait Details: pt able to take a few steps forward/back from chair    Posture / Balance Dynamic Sitting Balance Sitting balance - Comments: Pt was able to remain at midline but required BUE support Balance Overall balance assessment: Needs assistance Sitting-balance support: Feet supported, Bilateral upper extremity supported Sitting balance-Leahy Scale: Fair Sitting balance - Comments: Pt was able to remain at midline but required BUE support Standing balance support: Bilateral upper extremity supported Standing balance-Leahy Scale: Poor Standing balance comment: reliant on UE support and PRN min A to maintain static standing, mod A with dynamic tasks    Special needs/care consideration Skin n/a and Special service needs n/a   Previous Home Environment (from acute therapy documentation) Living Arrangements: Spouse/significant other  Lives  With: Spouse Available Help at Discharge: Family Type of Home: House Home Layout: One level Home Access: Ramped entrance Bathroom Shower/Tub: Health visitor: Standard Bathroom Accessibility: Yes How Accessible: Accessible via walker Home Care Services: No Additional Comments: recently retired to spend time with her parents who are elderly; 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 (?)  Discharge Living Setting Plans for Discharge Living Setting: Patient's home Type of Home at Discharge: House Discharge Home Layout: One level Discharge Home Access: Ramped entrance Discharge  Bathroom Shower/Tub: Walk-in shower Discharge Bathroom Toilet: Standard Discharge Bathroom Accessibility: Yes How Accessible: Accessible via walker, Accessible via wheelchair Does the patient have any problems obtaining your medications?: No  Social/Family/Support Systems Patient Roles: Spouse Contact Information: [phone] Anticipated Caregiver: Wylodine Leffers Ability/Limitations of Caregiver: Min A-Mod A Caregiver Availability: 24/7 Discharge Plan Discussed with Primary Caregiver: Yes Is Caregiver In Agreement with Plan?: No Does Caregiver/Family have Issues with Lodging/Transportation while Pt is in Rehab?: No  Goals Patient/Family Goal for Rehab: PT/OT Min A Expected length of stay: 18-21 days Pt/Family Agrees to Admission and willing to participate: Yes Program Orientation Provided & Reviewed with Pt/Caregiver Including Roles  & Responsibilities: Yes  Decrease burden of Care through IP rehab admission: not anticipated  Possible need for SNF placement upon discharge: not anticipated   Patient Condition: I have reviewed medical records from [facility], spoken with CM, and patient. I met with patient at the bedside for inpatient rehabilitation assessment.  Patient will benefit from ongoing PT and OT, can actively participate in 3 hours of therapy a day 5 days of the week, and can make measurable gains during the admission.  Patient will also benefit from the coordinated team approach during an Inpatient Acute Rehabilitation admission.  The patient will receive intensive therapy as well as Rehabilitation physician, nursing, social worker, and care management interventions.  Due to safety, skin/wound care, disease management, medication administration, pain management, and patient education the patient requires 24 hour a day rehabilitation nursing.  The patient is currently max assist with mobility and basic ADLs.  Discharge setting and therapy post discharge at home with  home health is anticipated.  Patient has agreed to participate in the Acute Inpatient Rehabilitation Program and will admit today.  Preadmission Screen Completed By:  Stephania Fragmin, 08/12/2023 3:08 PM ______________________________________________________________________   Discussed status with Dr. Berline Chough on 08/12/23  at 3:08 PM  and received approval for admission today.  Admission Coordinator:  Stephania Fragmin, PT, time 3:08 PM Dorna Bloom 08/12/23    Assessment/Plan: Diagnosis: GBS- AIDP Does the need for close, 24 hr/day Medical supervision in concert with the patient's rehab needs make it unreasonable for this patient to be served in a less intensive setting? Yes Co-Morbidities requiring supervision/potential complications: Hyacinth Meeker Fischer Variant GBS; generalized weakness- sensory changes Due to bladder management, bowel management, safety, skin/wound care, disease management, medication administration, pain management, and patient education, does the patient require 24 hr/day rehab nursing? Yes Does the patient require coordinated care of a physician, rehab nurse, PT, OT, and SLP to address physical and functional deficits in the context of the above medical diagnosis(es)? Yes Addressing deficits in the following areas: balance, endurance, locomotion, strength, transferring, bowel/bladder control, bathing, dressing, feeding, grooming, and toileting Can the patient actively participate in an intensive therapy program of at least 3 hrs of therapy 5 days a week? Yes The potential for patient to make measurable gains while on inpatient rehab is good Anticipated functional outcomes  upon discharge from inpatient rehab: modified independent and supervision PT, modified independent and supervision OT, n/a SLP Estimated rehab length of stay to reach the above functional goals is: 16-18 days Anticipated discharge destination: Home 10. Overall Rehab/Functional Prognosis: good   MD Signature:

## 2023-08-10 NOTE — Progress Notes (Signed)
NEUROLOGY PROGRESS NOTE   Date of service: August 10, 2023 Patient Name: Jill Allison MRN:  [id number] DOB:  Sep 18, 1961  HPI  Jill Allison is a 62 y.o. female with recent COVID 19 3weeks ago, GERD, osteoporosis, who presents with bilateral lower extremity pins and needle sensation along with poor coordination.   She reports that she got COVID 3 weeks ago and has been recovering from it pretty well.  She still has some respiratory symptoms including some postnasal dripping, hoarse voice and cough.  She reports that on Tuesday, she woke up with pins and needle sensation in bilateral feet affecting both the top and the bottom of her feet.  The next day, she woke up with similar feeling in her hands in both the anterior and the posterior aspects of her hands and her fingertips but not as bad.  She reports that on Thursday, she was having some trouble with her eyes but then also the pain in her feet was worse and now she was having trouble walking and she felt very incoordinated like a noodle.  She saw her PCP but since this was getting worse, she came into the ED.   In the ED, she had workup with her chemistry and CBC essentially nonrevealing.  She underwent LP which demonstrated elevated protein consistent with albumin no cytologic dissociation.  Case was discussed with neurology with plans to admit her to [facility] for in person neurology evaluation for concerns for Guillain-Barr syndrome.   She denies any prior similar presentations.  She eats a good mix of meat, vegetables and dairy.  She denies any new medications recently except for short course of amoxicillin that she was given.  She denies any fevers or headaches   Interval Hx/subjective   No acute changes. Tolerating IVIG well, completed dose 3/5 yesterday Reports mild improvement in strength, sensory disturbances, and some mild improvement in coordination overnight.  FVC and NIF remained stable overnight   Vitals    Vitals:   08/09/23 1742 08/09/23 2118 08/10/23 0051 08/10/23 0804  BP: (!) 159/83 (!) 151/89 134/63 137/65  Pulse: (!) 107 (!) 102 99 100  Resp:  17 17 15   Temp:  98.1 F (36.7 C) (!) 97.5 F (36.4 C) 98.3 F (36.8 C)  TempSrc:  Oral Axillary Oral  SpO2: 93% 93% 98% 94%  Weight:      Height:         Body mass index is 26.15 kg/m.  Physical Exam  General: Awake alert in no distress, laying in hospital bed resting with lights off on exam this morning HEENT: Normocephalic atraumatic Cardiovascular: Extremities warm, well perfused, 2+ pedal pulses bilaterally Abdomen nondistended nontender Neurological exam Awake alert oriented x 3 No dysarthria or aphasia Cranial nerve examination: Pupils equal round reactive to light, EOMI, no diplopia, no nystagmus, mild ptosis observed worse on right than left with no worsening of ptosis on sustained upward gaze, face grossly symmetric with no nasolabial fold flattening, tongue and palate midline. Motor examination with 4+/5 strength in the bilateral upper extremities, 4+/5 grip strength, dyscoordination out of prortion to confrontational weakness.  Nearly symmetric 5/5 strength in bilateral lower extremities. Sensation: Significant improvement in sensation reported this morning with very minimal "pins and needle" tingling of the bilateral lower extremities distal to the knee and the dorsal feet bilaterally and distal to elbows in BUE in a glove and stocking type pattern. Patient reports ongoing, though minimally improved, pins and needle sensation to bilateral plantar  feet and hands. Coordination: Significant ataxia bilaterally seems more like sensory ataxia, mildly improved in bilateral lower extremities.  Patient reports subjective improvement in ataxia of bilateral upper extremities though on exam, mild improvement this seen with FNF on left without change on the right.  DTRs are completely absent, left toes downgoing, right equal vocal. Neck  flexion and extension is about 4+/5.  NIF and FVC is normal. Labs and Diagnostic Imaging   CBC:  Recent Labs  Lab 08/06/23 0850 08/07/23 0628  WBC 6.1 5.8  HGB 12.3 11.9*  HCT 39.6 38.7  MCV 83.5 82.0  PLT 330 319   Basic Metabolic Panel:  Lab Results  Component Value Date   NA 139 08/07/2023   K 4.1 08/07/2023   CO2 28 08/07/2023   GLUCOSE 99 08/07/2023   BUN 16 08/07/2023   CREATININE 0.73 08/07/2023   CALCIUM 9.3 08/07/2023   GFRNONAA >60 08/07/2023   GFRAA >90 04/01/2012   HgbA1c:  Lab Results  Component Value Date   HGBA1C 6.0 (H) 08/06/2023    Recommendations   Jill Allison is a 62 y.o. 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   Recommendations  Continue IVIG for total of 5 doses as recommended in the initial consult. Dose 4/5 to be completed today.  NIF and FVC have been stable.  Neck flexor and extensor strength is good on exam and she does not exhibit any shortness of breath. NIF and VC QD Myasthenia labs have been ordered-will need outpatient follow-up. Gq1b was not ordered. She now has received IVIG, so I am not sure if there is utility in sending it out now, but will order. She will need outpatient follow-up with neuromuscular neurology 4 to 6 weeks after discharge  Lanae Boast, AGACNP-BC [facility] [phone]  I have seen the patient and reviewed the above note.   She appears to be having improvement with IVIG, suggesting that she has passed her nadir.  Her exam, CSF, and IVIG response are all very  consistent with GBS variant would favor continuing to treated as such.  Neurology will continue to follow  Ritta Slot, MD [facility] [phone]  If 7pm- 7am, please page neurology on call as listed in AMION.

## 2023-08-10 NOTE — Plan of Care (Signed)
  Problem: Education: Goal: Knowledge of General Education information will improve Description Including pain rating scale, medication(s)/side effects and non-pharmacologic comfort measures Outcome: Progressing   Problem: Health Behavior/Discharge Planning: Goal: Ability to manage health-related needs will improve Outcome: Progressing   Problem: Clinical Measurements: Goal: Ability to maintain clinical measurements within normal limits will improve Outcome: Progressing   Problem: Clinical Measurements: Goal: Will remain free from infection Outcome: Progressing   Problem: Safety: Goal: Ability to remain free from injury will improve Outcome: Progressing   Problem: Pain Managment: Goal: General experience of comfort will improve Outcome: Progressing

## 2023-08-10 NOTE — Progress Notes (Signed)
Pt had witnessed fall. 2 RN and NT present. Maija PT had gotten patient to chair with stedy earlier in the day and had messaged this RN to use stedy to get patient back in bed. Gait belt applied to patient. Patient stood with assistance and sat on chair of stedy. When moving stedy close to the bed, the patient's feet kicked back behind the foot plate. RN attemped to get feet back on plate, however the patient slid forward off the chair/stedy. Extra staff called to room. Patient's R leg had slid out behind her and the patient was sitting on her L leg in an awkward position. Patient lifted by staff members back into bed safely. Bed alarm on. Fall precautions in place. No c/o pain from the fall. Vitals stable. MD Adhikari made aware and stated to continue to monitor patient.

## 2023-08-10 NOTE — Progress Notes (Signed)
Pt's Negative Inspiratory Force & Vital Capacity as follows: NIF= >-40cmH2O VC= 3.8 Liters

## 2023-08-10 NOTE — Plan of Care (Signed)

## 2023-08-11 DIAGNOSIS — R270 Ataxia, unspecified: Secondary | ICD-10-CM | POA: Diagnosis not present

## 2023-08-11 LAB — BASIC METABOLIC PANEL
Anion gap: 6 (ref 5–15)
BUN: 34 mg/dL — ABNORMAL HIGH (ref 8–23)
CO2: 27 mmol/L (ref 22–32)
Calcium: 8.8 mg/dL — ABNORMAL LOW (ref 8.9–10.3)
Chloride: 101 mmol/L (ref 98–111)
Creatinine, Ser: 0.67 mg/dL (ref 0.44–1.00)
GFR, Estimated: >60 mL/min (ref >60)
Glucose, Bld: 97 mg/dL (ref 70–99)
Potassium: 3.3 mmol/L — ABNORMAL LOW (ref 3.5–5.1)
Sodium: 134 mmol/L — ABNORMAL LOW (ref 135–145)

## 2023-08-11 LAB — CBC
HCT: 38 % (ref 36.0–46.0)
Hemoglobin: 11.8 g/dL — ABNORMAL LOW (ref 12.0–15.0)
MCH: 25.7 pg — ABNORMAL LOW (ref 26.0–34.0)
MCHC: 31.1 g/dL (ref 30.0–36.0)
MCV: 82.8 fL (ref 80.0–100.0)
Platelets: 264 10*3/uL (ref 150–400)
RBC: 4.59 MIL/uL (ref 3.87–5.11)
RDW: 15.6 % — ABNORMAL HIGH (ref 11.5–15.5)
WBC: 4.2 10*3/uL (ref 4.0–10.5)
nRBC: 0 % (ref 0.0–0.2)

## 2023-08-11 LAB — METHYLMALONIC ACID, SERUM: Methylmalonic Acid, Quantitative: 410 nmol/L — ABNORMAL HIGH (ref 0–378)

## 2023-08-11 NOTE — Plan of Care (Signed)

## 2023-08-11 NOTE — Progress Notes (Signed)
Occupational Therapy Treatment Patient Details Name: Jill Allison MRN: [id number] DOB: 09/14/1961 Today's Date: 08/11/2023   History of present illness 62 y.o. female admitted to [facility] on 08/06/23 due to fatigue, generalized pain and bodyaches, numbness/tingling in hands and feet. 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 Pt started on IVIG on 08/07/23 for a 5 day course. No PMH.   OT comments  Jill Allison is very motivated to work with therapy but a little fearful of transfers due to fall yesterday but reported no residual pain. She was able to complete rolling side to side in bed with supervision and supine to sit with HOB elevated and bed rails with CGA. Jill Allison then was able to remain at midline when sitting at EOB with BUE support and then was able to complete weight shifting  and then come back to a midline position. Pt agreed to complete one sit to stand transfer with RW with max assist from elevated surface and was able to stand for about 3 seconds prior to needing to sit back down with max assist due to increase in ataxic movements. She did then agreed to attempt lateral scoots to [facility] with moderate assist and pt reported feeling more confident with being able to complete. Patient will benefit from intensive inpatient follow up therapy, >3 hours/day.       If plan is discharge home, recommend the following:  Two people to help with walking and/or transfers;A lot of help with bathing/dressing/bathroom;Assistance with cooking/housework;Help with stairs or ramp for entrance   Equipment Recommendations  BSC/3in1;Wheelchair (measurements OT);Other (comment)    Recommendations for Other Services Rehab consult    Precautions / Restrictions Precautions Precautions: Fall Precaution Comments: ataxia Restrictions Weight Bearing Restrictions: No       Mobility Bed Mobility Overal bed mobility: Needs Assistance Bed Mobility:  Supine to Sit, Rolling, Sit to Supine Rolling: Supervision   Supine to sit: Contact guard, HOB elevated, Used rails Sit to supine: Min assist   General bed mobility comments: Pt was more fearful but very motivated to trial as reported on last attempt they started to feel very ill with positional changes and hot    Transfers Overall transfer level: Needs assistance Equipment used: Rolling walker (2 wheels) Transfers: Sit to/from Stand Sit to Stand: From elevated surface, Max assist           General transfer comment: started with working at EOB with weightshifting while mainting midline position then completed one sit to stand transfer as pt was very fearful from last session and was able to stand for nly about 3 seconds as started to have decrease in control of fully body ataxia. Pt then required max assist to lower back to bed     Balance Overall balance assessment: Needs assistance Sitting-balance support: Feet supported, Bilateral upper extremity supported Sitting balance-Leahy Scale: Fair Sitting balance - Comments: Pt was able to remain at midline but required BUE support   Standing balance support: Bilateral upper extremity supported Standing balance-Leahy Scale: Zero                             ADL either performed or assessed with clinical judgement   ADL Overall ADL's : Needs assistance/impaired Eating/Feeding: Minimal assistance;Sitting Eating/Feeding Details (indicate cue type and reason): Pt reported decrease in dropping items but does need some assist with BUE such as cutting Grooming: Contact guard assist;Wash/dry hands;Wash/dry face;Sitting;Bed level   Upper  Body Bathing: Minimal assistance;Sitting;Bed level   Lower Body Bathing: Maximal assistance;Sitting/lateral leans   Upper Body Dressing : Minimal assistance;Sitting;Bed level   Lower Body Dressing: Maximal assistance;Sitting/lateral leans;Cueing for safety;Cueing for sequencing        Toileting- Clothing Manipulation and Hygiene: Moderate assistance;Cueing for safety;Cueing for sequencing;Bed level Toileting - Clothing Manipulation Details (indicate cue type and reason): rolling side to side       General ADL Comments: Deffered for further sessions    Extremity/Trunk Assessment Upper Extremity Assessment Upper Extremity Assessment: Generalized weakness;RUE deficits/detail;LUE deficits/detail RUE Deficits / Details: ataxic and weakness RUE Sensation: decreased proprioception RUE Coordination: decreased fine motor;decreased gross motor LUE Deficits / Details: ataxic movements LUE Sensation: decreased proprioception LUE Coordination: decreased fine motor;decreased gross motor            Vision   Vision Assessment?: No apparent visual deficits   Perception     Praxis      Cognition Arousal: Alert Behavior During Therapy: WFL for tasks assessed/performed Overall Cognitive Status: Within Functional Limits for tasks assessed                                          Exercises      Shoulder Instructions       General Comments      Pertinent Vitals/ Pain       Pain Assessment Pain Assessment: No/denies pain Pain Intervention(s):  (Pt reported that they have no pain from fall yesterday)  Home Living                                          Prior Functioning/Environment              Frequency  Min 2X/week        Progress Toward Goals  OT Goals(current goals can now be found in the care plan section)  Progress towards OT goals: Progressing toward goals  Acute Rehab OT Goals Patient Stated Goal: to go to rehab OT Goal Formulation: With patient/family Time For Goal Achievement: 08/21/23 Potential to Achieve Goals: Good ADL Goals Pt Will Perform Grooming: with set-up;sitting Pt Will Perform Upper Body Bathing: with supervision;sitting Pt Will Perform Lower Body Bathing: with contact guard  assist;sitting/lateral leans;sit to/from stand Pt Will Perform Upper Body Dressing: with supervision;sitting Pt Will Perform Lower Body Dressing: with contact guard assist;sitting/lateral leans;sit to/from stand Pt Will Transfer to Toilet: with contact guard assist;ambulating Pt Will Perform Toileting - Clothing Manipulation and hygiene: with contact guard assist;sitting/lateral leans;sit to/from stand  Plan      Co-evaluation                 AM-PAC OT "6 Clicks" Daily Activity     Outcome Measure   Help from another person eating meals?: A Little Help from another person taking care of personal grooming?: A Little Help from another person toileting, which includes using toliet, bedpan, or urinal?: A Lot Help from another person bathing (including washing, rinsing, drying)?: A Lot Help from another person to put on and taking off regular upper body clothing?: A Little Help from another person to put on and taking off regular lower body clothing?: A Lot 6 Click Score: 15    End of Session Equipment Utilized During Treatment: Gait belt;Rolling walker (  2 wheels)  OT Visit Diagnosis: Unsteadiness on feet (R26.81);Other abnormalities of gait and mobility (R26.89);Muscle weakness (generalized) (M62.81);Other symptoms and signs involving the nervous system (R29.898)   Activity Tolerance Patient limited by fatigue   Patient Left in bed;with call bell/phone within reach;with bed alarm set   Nurse Communication Mobility status        Time: 2130-8657 OT Time Calculation (min): 52 min  Charges: OT General Charges $OT Visit: 1 Visit OT Treatments $Self Care/Home Management : 38-52 mins  Presley Raddle OTR/L  Acute Rehab Services  [phone] office number   Alphia Moh 08/11/2023, 9:50 AM

## 2023-08-11 NOTE — Progress Notes (Signed)
NEUROLOGY CONSULT FOLLOW UP NOTE   Date of service: August 11, 2023 Patient Name: Jill Allison MRN:  [id number] DOB:  04/13/1961  Brief HPI  Jill Allison is a 62 y.o. female who presented 3 weeks after COVID with ptosis, progressive numbness, ataxia, weakness and was found to have areflexia and albuminocytologic dissociation.  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   Interval Hx/subjective   She feels that she is improving Vitals   Vitals:   08/10/23 2202 08/11/23 0051 08/11/23 0500 08/11/23 0800  BP: (!) 147/78 137/77 (!) 144/82 (!) 140/85  Pulse: (!) 102 95 92 96  Resp: 17 17 17 17   Temp: 98.3 F (36.8 C) 98.3 F (36.8 C) 98 F (36.7 C) 98 F (36.7 C)  TempSrc: Oral Oral Oral Oral  SpO2: 95% 92% 94%   Weight:      Height:         Body mass index is 26.15 kg/m.  Physical Exam   General: In bed, NAD  Neurologic Examination   MS: Awake, alert, interactive and appropriate CN: Her ptosis is much improved, at rest, I do question whether there might be a very mild decreased nasolabial fold on the right as well, extraocular movements are intact, pupils are reactive bilaterally Motor: She has mild hip flexion weakness, but otherwise has good strength throughout Sensory: She has diminished sensation to temperature below the knees, mildly diminished to vibration as well.  Labs and Diagnostic Imaging   CBC:  Recent Labs  Lab 08/07/23 0628 08/11/23 0522  WBC 5.8 4.2  HGB 11.9* 11.8*  HCT 38.7 38.0  MCV 82.0 82.8  PLT 319 264    Basic Metabolic Panel:  Lab Results  Component Value Date   NA 134 (L) 08/11/2023   K 3.3 (L) 08/11/2023   CO2 27 08/11/2023   GLUCOSE 97 08/11/2023   BUN 34 (H) 08/11/2023   CREATININE 0.67 08/11/2023   CALCIUM 8.8 (L) 08/11/2023   GFRNONAA >60 08/11/2023   GFRAA >90 04/01/2012   Lipid Panel: No results found for:  "LDLCALC" HgbA1c:  Lab Results  Component Value Date   HGBA1C 6.0 (H) 08/06/2023   Urine Drug Screen: No results found for: "LABOPIA", "COCAINSCRNUR", "LABBENZ", "AMPHETMU", "THCU", "LABBARB"  Alcohol Level No results found for: "ETH" INR No results found for: "INR" APTT No results found for: "APTT"  Impression   Jill Allison is a 62 y.o. female who presented with areflexia, ataxia, ptosis with elevated protein without pleocytosis.  I agree that this presentation is most consistent with Guillain-Barr syndrome and her improvement with IVIG would go along with this.  She will finish her IVIG today, and following this care is purely supportive with physical therapy.  With her improving and never having any respiratory compromise, I think we can stop checking NIFs.  I indicated that I would expect continued response of IVIG with maximal benefit at a couple of weeks, following that a more slow improvement after that.  Recommendations  Discontinue NIF/VC Continue IVIG, last dose today Continue PT/OT Should be ready for rehab placement following her last dose of IVIG. ______________________________________________________________________   Thank you for the opportunity to take part in the care of this patient. If you have any further questions, please contact the neurology consultation team on call. Updated oncall schedule is listed on AMION.  Signed,  Ritta Slot, MD [facility]  [phone]  If 7pm- 7am, please page neurology on call as listed in AMION.

## 2023-08-11 NOTE — Plan of Care (Signed)
  Problem: Education: Goal: Knowledge of General Education information will improve Description: Including pain rating scale, medication(s)/side effects and non-pharmacologic comfort measures 08/11/2023 2048 by Tennis Ship, RN Outcome: Progressing 08/11/2023 2048 by Tennis Ship, RN Outcome: Progressing   Problem: Health Behavior/Discharge Planning: Goal: Ability to manage health-related needs will improve 08/11/2023 2048 by Tennis Ship, RN Outcome: Progressing 08/11/2023 2048 by Tennis Ship, RN Outcome: Progressing   Problem: Clinical Measurements: Goal: Ability to maintain clinical measurements within normal limits will improve 08/11/2023 2048 by Tennis Ship, RN Outcome: Progressing 08/11/2023 2048 by Tennis Ship, RN Outcome: Progressing Goal: Will remain free from infection 08/11/2023 2048 by Tennis Ship, RN Outcome: Progressing 08/11/2023 2048 by Tennis Ship, RN Outcome: Progressing Goal: Diagnostic test results will improve 08/11/2023 2048 by Tennis Ship, RN Outcome: Progressing 08/11/2023 2048 by Tennis Ship, RN Outcome: Progressing Goal: Respiratory complications will improve 08/11/2023 2048 by Tennis Ship, RN Outcome: Progressing 08/11/2023 2048 by Tennis Ship, RN Outcome: Progressing Goal: Cardiovascular complication will be avoided 08/11/2023 2048 by Tennis Ship, RN Outcome: Progressing 08/11/2023 2048 by Tennis Ship, RN Outcome: Progressing   Problem: Activity: Goal: Risk for activity intolerance will decrease 08/11/2023 2048 by Tennis Ship, RN Outcome: Progressing 08/11/2023 2048 by Tennis Ship, RN Outcome: Progressing   Problem: Nutrition: Goal: Adequate nutrition will be maintained 08/11/2023 2048 by Tennis Ship, RN Outcome: Progressing 08/11/2023 2048 by Tennis Ship, RN Outcome: Progressing   Problem: Coping: Goal: Level  of anxiety will decrease 08/11/2023 2048 by Tennis Ship, RN Outcome: Progressing 08/11/2023 2048 by Tennis Ship, RN Outcome: Progressing   Problem: Elimination: Goal: Will not experience complications related to bowel motility 08/11/2023 2048 by Tennis Ship, RN Outcome: Progressing 08/11/2023 2048 by Tennis Ship, RN Outcome: Progressing Goal: Will not experience complications related to urinary retention 08/11/2023 2048 by Tennis Ship, RN Outcome: Progressing 08/11/2023 2048 by Tennis Ship, RN Outcome: Progressing   Problem: Pain Managment: Goal: General experience of comfort will improve 08/11/2023 2048 by Tennis Ship, RN Outcome: Progressing 08/11/2023 2048 by Tennis Ship, RN Outcome: Progressing   Problem: Safety: Goal: Ability to remain free from injury will improve 08/11/2023 2048 by Tennis Ship, RN Outcome: Progressing 08/11/2023 2048 by Tennis Ship, RN Outcome: Progressing   Problem: Skin Integrity: Goal: Risk for impaired skin integrity will decrease 08/11/2023 2048 by Tennis Ship, RN Outcome: Progressing 08/11/2023 2048 by Tennis Ship, RN Outcome: Progressing

## 2023-08-11 NOTE — Progress Notes (Signed)
Inpatient Rehab Admissions Coordinator:    CIR following. Case sent to insurance this AM.  Husband confirmed he can do 24/7 support at d/c.  Megan Salon, MS, CCC-SLP Rehab Admissions Coordinator  [phone] (celll) [phone] (office)

## 2023-08-11 NOTE — Progress Notes (Addendum)
PROGRESS NOTE  Jill Allison  WNU:[id number] DOB: 1961/06/17 DOA: 08/06/2023 PCP: Mila Palmer, MD   Brief Narrative: Patient is a 62 year old female with history of anxiety who presented with multiple symptoms including fatigue, generalized pain and bodyaches, numbness/tingling on her hands and feet.  She recently had COVID infection about 3 weeks ago.  On presentation, she was hemodynamically stable but mildly hypertensive.  Vitamin B12 was found to be 200.  LP was performed which showed elevated albumin, low suspicion for meningitis/encephalitis.  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  Started on IVIG,day 5/5.  Does not have focal deficits but continues to be ataxic, needing assistance with ambulation.  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  Recent history of COVID.  Also had mild weakness in bilateral lower extremities.  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  Neurology consulted.  Started on IVIG.  Plan for 5 days.Day 4/5 Acetylcholine receptor negative, MuSK  Ab pending NIF's and vital capacity every 12 hours.Stable Consulted PT and OT, recommendation is AIR. Neurology recommending to follow-up with neuromuscular neurology for possible EMG/NCS  in 4 to 6 weeks.Does not have focal deficits but continues to be ataxic, needing assistance with ambulation.    Vitamin B12 deficiency: Supplemented.  Methylmalonic acid level pending.  Hypertension: Does not take any medication at home.  Noted to be hypertensive here.  Started  on amlodipine, blood pressure better today        DVT  prophylaxis:enoxaparin (LOVENOX) injection 40 mg Start: 08/08/23 1200Lovenox     Code Status: Full Code  Family Communication: Discussed with husband at bedside on 10/19  Patient status:obs  Patient is from : Home  Anticipated discharge to:CIR  Estimated DC date: After completion of IVIG depending upon the bed availability at [facility]   Consultants: Neurology  Procedures: None  Antimicrobials:  Anti-infectives (From admission, onward)    None       Subjective: Patient seen and examined at bedside today.  Hemodynamically stable.  She says her numbness and tingling have been better than yesterday.  Patient continues to be unsteady, ataxic while ambulating.  She fell yesterday but denies any complaints or pain.  No focal deficits  Objective: Vitals:   08/10/23 2202 08/11/23 0051 08/11/23 0500 08/11/23 0800  BP: (!) 147/78 137/77 (!) 144/82 (!) 140/85  Pulse: (!) 102 95 92 96  Resp: 17 17 17 17   Temp: 98.3 F (36.8 C) 98.3 F (36.8 C) 98 F (36.7 C) 98 F (36.7 C)  TempSrc: Oral Oral Oral Oral  SpO2: 95% 92% 94%   Weight:      Height:        Intake/Output Summary (Last 24 hours) at 08/11/2023 1025 Last data filed at 08/11/2023 0347 Gross per 24 hour  Intake --  Output 450 ml  Net -450 ml   Filed Weights   08/06/23 0722  Weight: 73.5 kg    Examination:  General exam: Overall comfortable, not in distress HEENT: PERRL Respiratory system:  no wheezes or crackles  Cardiovascular system: S1 & S2 heard, RRR.  Gastrointestinal system: Abdomen is nondistended, soft and nontender. Central nervous system: Alert and oriented Extremities: No edema, no clubbing ,no cyanosis Skin: No rashes, no  ulcers,no icterus    Data Reviewed: I have personally reviewed following labs and imaging studies  CBC: Recent Labs  Lab 08/06/23 0850 08/07/23 0628 08/11/23 0522  WBC 6.1 5.8 4.2  HGB 12.3 11.9* 11.8*  HCT 39.6 38.7 38.0  MCV 83.5 82.0 82.8  PLT 330 319 264   Basic  Metabolic Panel: Recent Labs  Lab 08/06/23 0850 08/07/23 0628 08/11/23 0522  NA 141 139 134*  K 4.2 4.1 3.3*  CL 103 103 101  CO2 31 28 27   GLUCOSE 103* 99 97  BUN 17 16 34*  CREATININE 0.81 0.73 0.67  CALCIUM 9.5 9.3 8.8*  MG 2.0  --   --      Recent Results (from the past 240 hour(s))  CSF culture w Gram Stain     Status: None   Collection Time: 08/06/23  1:30 PM   Specimen: CSF; Cerebrospinal Fluid  Result Value Ref Range Status   Specimen Description   Final    CSF Performed at [facility], [address], [place], [place] [address]    Special Requests   Final    NONE Performed at [facility], [address] [place], [place] [address]    Gram Stain NO WBC SEEN NO ORGANISMS SEEN   Final   Culture   Final    NO GROWTH 3 DAYS Performed at [facility], [address], [place], [place] [address]    Report Status 08/10/2023 FINAL  Final     Radiology Studies: No results found.  Scheduled Meds:  amLODipine  10 mg Oral Daily   aspirin EC  81 mg Oral Daily   vitamin B-12  1,000 mcg Oral Daily   enoxaparin (LOVENOX) injection  40 mg Subcutaneous Q24H   famotidine  20 mg Oral Daily   losartan  50 mg Oral Daily   PARoxetine  20 mg Oral Daily   sodium chloride flush  3 mL Intravenous Q12H   Continuous Infusions:  Immune Globulin 10% Stopped (08/10/23 1630)     LOS: 4 days   Burnadette Pop, MD [facility] P10/23/2024, 10:25 AM

## 2023-08-12 ENCOUNTER — Encounter (HOSPITAL_COMMUNITY): Payer: Self-pay | Admitting: Physical Medicine and Rehabilitation

## 2023-08-12 ENCOUNTER — Other Ambulatory Visit: Payer: Self-pay

## 2023-08-12 ENCOUNTER — Inpatient Hospital Stay (HOSPITAL_COMMUNITY)
Admission: AD | Admit: 2023-08-12 | Discharge: 2023-08-19 | DRG: 095 | Disposition: A | Discharge status: Home / Self Care | Payer: 59 | Source: Intra-hospital | Attending: Physical Medicine and Rehabilitation | Admitting: Physical Medicine and Rehabilitation

## 2023-08-12 DIAGNOSIS — G610 Guillain-Barre syndrome: Secondary | ICD-10-CM

## 2023-08-12 DIAGNOSIS — K5900 Constipation, unspecified: Secondary | ICD-10-CM | POA: Diagnosis present

## 2023-08-12 DIAGNOSIS — Z79899 Other long term (current) drug therapy: Secondary | ICD-10-CM | POA: Diagnosis not present

## 2023-08-12 DIAGNOSIS — R269 Unspecified abnormalities of gait and mobility: Secondary | ICD-10-CM | POA: Diagnosis present

## 2023-08-12 DIAGNOSIS — R531 Weakness: Secondary | ICD-10-CM | POA: Diagnosis present

## 2023-08-12 DIAGNOSIS — E876 Hypokalemia: Secondary | ICD-10-CM | POA: Diagnosis present

## 2023-08-12 DIAGNOSIS — G6181 Chronic inflammatory demyelinating polyneuritis: Secondary | ICD-10-CM | POA: Diagnosis present

## 2023-08-12 DIAGNOSIS — R21 Rash and other nonspecific skin eruption: Secondary | ICD-10-CM | POA: Diagnosis present

## 2023-08-12 DIAGNOSIS — Z7982 Long term (current) use of aspirin: Secondary | ICD-10-CM | POA: Diagnosis not present

## 2023-08-12 DIAGNOSIS — M545 Low back pain, unspecified: Secondary | ICD-10-CM | POA: Diagnosis present

## 2023-08-12 DIAGNOSIS — I959 Hypotension, unspecified: Secondary | ICD-10-CM | POA: Diagnosis not present

## 2023-08-12 DIAGNOSIS — I10 Essential (primary) hypertension: Secondary | ICD-10-CM | POA: Diagnosis present

## 2023-08-12 DIAGNOSIS — Z8616 Personal history of COVID-19: Secondary | ICD-10-CM | POA: Diagnosis not present

## 2023-08-12 DIAGNOSIS — R270 Ataxia, unspecified: Secondary | ICD-10-CM | POA: Diagnosis not present

## 2023-08-12 LAB — CBC
HCT: 35.9 % — ABNORMAL LOW (ref 36.0–46.0)
Hemoglobin: 11.1 g/dL — ABNORMAL LOW (ref 12.0–15.0)
MCH: 25.9 pg — ABNORMAL LOW (ref 26.0–34.0)
MCHC: 30.9 g/dL (ref 30.0–36.0)
MCV: 83.7 fL (ref 80.0–100.0)
Platelets: 243 10*3/uL (ref 150–400)
RBC: 4.29 MIL/uL (ref 3.87–5.11)
RDW: 15.6 % — ABNORMAL HIGH (ref 11.5–15.5)
WBC: 3.5 10*3/uL — ABNORMAL LOW (ref 4.0–10.5)
nRBC: 0 % (ref 0.0–0.2)

## 2023-08-12 MED ORDER — ALUM & MAG HYDROXIDE-SIMETH 200-200-20 MG/5ML PO SUSP: 30.0000 mL | ORAL | Status: DC | PRN

## 2023-08-12 MED ORDER — BISACODYL 5 MG PO TBEC: 5.0000 mg | DELAYED_RELEASE_TABLET | Freq: Every day | ORAL | Status: DC | PRN

## 2023-08-12 MED ORDER — CAMPHOR-MENTHOL 0.5-0.5 % EX LOTN
TOPICAL_LOTION | CUTANEOUS | Status: DC | PRN
  Filled 2023-08-12: qty 222

## 2023-08-12 MED ORDER — POTASSIUM CHLORIDE CRYS ER 20 MEQ PO TBCR
40.0000 meq | EXTENDED_RELEASE_TABLET | Freq: Once | ORAL | Status: AC
  Administered 2023-08-12: 40 meq via ORAL
  Filled 2023-08-12: qty 2

## 2023-08-12 MED ORDER — PAROXETINE HCL 20 MG PO TABS
20.0000 mg | ORAL_TABLET | Freq: Every day | ORAL | Status: DC
  Administered 2023-08-12 – 2023-08-18 (×7): 20 mg via ORAL
  Filled 2023-08-12 (×7): qty 1

## 2023-08-12 MED ORDER — ACETAMINOPHEN 325 MG PO TABS
325.0000 mg | ORAL_TABLET | ORAL | Status: DC | PRN
  Administered 2023-08-12 – 2023-08-18 (×10): 650 mg via ORAL
  Filled 2023-08-12 (×10): qty 2

## 2023-08-12 MED ORDER — LOSARTAN POTASSIUM 50 MG PO TABS
50.0000 mg | ORAL_TABLET | Freq: Every day | ORAL | Status: DC
  Administered 2023-08-13 – 2023-08-19 (×7): 50 mg via ORAL
  Filled 2023-08-12 (×7): qty 1

## 2023-08-12 MED ORDER — POLYETHYLENE GLYCOL 3350 17 G PO PACK: 17.0000 g | PACK | Freq: Every day | ORAL | Status: DC | PRN

## 2023-08-12 MED ORDER — CYANOCOBALAMIN 1000 MCG PO TABS: 1000.0000 ug | ORAL_TABLET | Freq: Every day | ORAL | Status: DC

## 2023-08-12 MED ORDER — GUAIFENESIN-DM 100-10 MG/5ML PO SYRP
10.0000 mL | ORAL_SOLUTION | Freq: Four times a day (QID) | ORAL | Status: DC | PRN
Start: 2023-08-12 — End: 2023-08-19

## 2023-08-12 MED ORDER — DOCUSATE SODIUM 100 MG PO CAPS
100.0000 mg | ORAL_CAPSULE | Freq: Every day | ORAL | Status: DC
  Administered 2023-08-12 – 2023-08-18 (×7): 100 mg via ORAL
  Filled 2023-08-12 (×7): qty 1

## 2023-08-12 MED ORDER — METHOCARBAMOL 500 MG PO TABS
500.0000 mg | ORAL_TABLET | Freq: Four times a day (QID) | ORAL | Status: DC | PRN
  Administered 2023-08-13 – 2023-08-18 (×6): 500 mg via ORAL
  Filled 2023-08-12 (×6): qty 1

## 2023-08-12 MED ORDER — AMLODIPINE BESYLATE 10 MG PO TABS: 10.0000 mg | ORAL_TABLET | Freq: Every day | ORAL | Status: DC

## 2023-08-12 MED ORDER — VITAMIN B-12 1000 MCG PO TABS
1000.0000 ug | ORAL_TABLET | Freq: Every day | ORAL | Status: DC
  Administered 2023-08-13 – 2023-08-19 (×7): 1000 ug via ORAL
  Filled 2023-08-12 (×7): qty 1

## 2023-08-12 MED ORDER — OXYCODONE HCL 5 MG PO TABS
5.0000 mg | ORAL_TABLET | ORAL | Status: DC | PRN
  Filled 2023-08-12: qty 1

## 2023-08-12 MED ORDER — FLEET ENEMA RE ENEM: 1.0000 | ENEMA | Freq: Once | RECTAL | Status: DC | PRN

## 2023-08-12 MED ORDER — ENOXAPARIN SODIUM 40 MG/0.4ML IJ SOSY: 40.0000 mg | PREFILLED_SYRINGE | INTRAMUSCULAR | Status: DC

## 2023-08-12 MED ORDER — ENOXAPARIN SODIUM 40 MG/0.4ML IJ SOSY
40.0000 mg | PREFILLED_SYRINGE | INTRAMUSCULAR | Status: DC
  Administered 2023-08-13 – 2023-08-18 (×6): 40 mg via SUBCUTANEOUS
  Filled 2023-08-12 (×6): qty 0.4

## 2023-08-12 MED ORDER — AMLODIPINE BESYLATE 10 MG PO TABS
10.0000 mg | ORAL_TABLET | Freq: Every day | ORAL | Status: DC
  Administered 2023-08-13 – 2023-08-14 (×2): 10 mg via ORAL
  Filled 2023-08-12 (×2): qty 1

## 2023-08-12 MED ORDER — ASPIRIN 81 MG PO TBEC
81.0000 mg | DELAYED_RELEASE_TABLET | Freq: Every day | ORAL | Status: DC
  Administered 2023-08-12 – 2023-08-18 (×7): 81 mg via ORAL
  Filled 2023-08-12 (×7): qty 1

## 2023-08-12 MED ORDER — ONDANSETRON HCL 4 MG PO TABS: 4.0000 mg | ORAL_TABLET | Freq: Four times a day (QID) | ORAL | Status: DC | PRN

## 2023-08-12 MED ORDER — LOSARTAN POTASSIUM 50 MG PO TABS: 50.0000 mg | ORAL_TABLET | Freq: Every day | ORAL | Status: DC

## 2023-08-12 MED ORDER — FAMOTIDINE 20 MG PO TABS
20.0000 mg | ORAL_TABLET | Freq: Every day | ORAL | Status: DC
  Administered 2023-08-12 – 2023-08-18 (×7): 20 mg via ORAL
  Filled 2023-08-12 (×7): qty 1

## 2023-08-12 MED ORDER — ONDANSETRON HCL 4 MG/2ML IJ SOLN: 4.0000 mg | Freq: Four times a day (QID) | INTRAMUSCULAR | Status: DC | PRN

## 2023-08-12 NOTE — Progress Notes (Signed)
NEUROLOGY CONSULT FOLLOW UP NOTE   Date of service: August 12, 2023 Patient Name: Jill Allison MRN:  [id number] DOB:  [birth year]  Brief HPI  Jill Allison is a 62 y.o. female who presented 3 weeks after COVID with ptosis, progressive numbness, ataxia, weakness and was found to have areflexia and albuminocytologic dissociation.  This presentation was felt to be most consistent with a variant of Guillain-Barr syndrome, likely Jill Allison and she was started on IVIG on 10/19.   Interval Hx/subjective   She continues to improve Vitals   Vitals:   08/11/23 1959 08/11/23 2352 08/12/23 0500 08/12/23 0717  BP: (!) 134/55 136/75 127/81 (!) 140/77  Pulse: (!) 106 (!) 102 93 93  Resp:  18  17  Temp: 99.7 F (37.6 C) 98.5 F (36.9 C) 98 F (36.7 C) 98.4 F (36.9 C)  TempSrc: Oral Oral Oral Oral  SpO2: 92% 93% 95% 95%  Weight:      Height:         Body mass index is 26.15 kg/m.  Physical Exam   General: In bed, NAD  Neurologic Examination   MS: Awake, alert, interactive and appropriate CN: Her ptosis is much improved, at rest, I do question whether there might be a very mild decreased nasolabial fold on the right as well, extraocular movements are intact, pupils are reactive bilaterally Motor: She has mild hip flexion weakness, but otherwise has good strength throughout Sensory: She has diminished sensation to temperature below the knees,  diminished to vibration as well at the toes.  Vibration sensation in her fingers, though still very mildly diminished, is improved compared to earlier in the hospitalization.  Labs and Diagnostic Imaging   CBC:  Recent Labs  Lab 08/07/23 0628 08/11/23 0522  WBC 5.8 4.2  HGB 11.9* 11.8*  HCT 38.7 38.0  MCV 82.0 82.8  PLT 319 264    Basic Metabolic Panel:  Lab Results  Component Value Date   NA 134 (L) 08/11/2023   K 3.3 (L) 08/11/2023   CO2 27 08/11/2023   GLUCOSE 97 08/11/2023   BUN 34 (H) 08/11/2023   CREATININE 0.67  08/11/2023   CALCIUM 8.8 (L) 08/11/2023   GFRNONAA >60 08/11/2023   GFRAA >90 04/01/2012   Lipid Panel: No results found for: "LDLCALC" HgbA1c:  Lab Results  Component Value Date   HGBA1C 6.0 (H) 08/06/2023   Urine Drug Screen: No results found for: "LABOPIA", "COCAINSCRNUR", "LABBENZ", "AMPHETMU", "THCU", "LABBARB"  Alcohol Level No results found for: "ETH" INR No results found for: "INR" APTT No results found for: "APTT"  Impression   Jill Allison is a 62 y.o. female who presented with areflexia, ataxia, ptosis with elevated protein without pleocytosis.  I agree that this presentation is most consistent with Guillain-Barr syndrome and her improvement with IVIG would go along with this.   I indicated that I would expect continued response of IVIG with maximal benefit at a couple of weeks, following that a more slow improvement after that.  I do not have any further inpatient therapies to offer at this time, neurology will be available on an as-needed basis moving forward.  Recommendations  Continue PT/OT Neurology will sign off, please call with further questions or concerns. ______________________________________________________________________   Thank you for the opportunity to take part in the care of this patient. If you have any further questions, please contact the neurology consultation team on call. Updated oncall schedule is listed on AMION.  Signed,  Ritta Slot,  MD [facility] [phone]  If 7pm- 7am, please page neurology on call as listed in AMION.

## 2023-08-12 NOTE — H&P (Signed)
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    HPI: Jill Allison is a 62 year old female who presented to department on 08/06/2023 complaining of increasing pain and weakness in the extremities bilaterally.  She reported a history of COVID infection approximately 2 weeks prior to admission.  She was recently treated for upper respiratory infection and placed on antibiotics.  On presentation, she described pain in both feet, numbness and tingling in both hands.  Vitamin B12 level 200.  Lumbar puncture was performed which showed elevated albumin, low suspicion for meningitis/encephalitis.  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  She was started on IVIG and completed 5-day course.  Over the course of her hospitalization, she was noted to have elevated blood pressure readings and started on amlodipine and losartan. Has undergone PT/OT evals. The patient requires inpatient medicine and rehabilitation evaluations and services for ongoing dysfunction secondary to suspected AIDP.  Her LBM was this AM- usually takes Colace daily at home for constipation.  Using Purewick- admits urine is dark due to drinking a lot of coke up til now - doesn't like water, but drinking right now. "Nothing quenching her thirst".  Does note had COVID 3 weeks ago- 2 weeks prior to admission. No vaccines.  More strength today however notes fell using Steady a few days ago.  .     Review of Systems  Constitutional:  Negative for chills and fever.  Respiratory:  Positive for cough. Negative for shortness of breath.        C/o intermittent dry cough  Cardiovascular:  Negative for chest pain and palpitations.  Gastrointestinal:  Negative for constipation, nausea and vomiting.  Genitourinary:  Negative for dysuria and urgency.  Musculoskeletal:   Negative for back pain and neck pain.  Neurological:  Negative for dizziness and headaches.  Psychiatric/Behavioral:  Negative for depression. The patient is not nervous/anxious.   All other systems reviewed and are negative.  History reviewed. No pertinent past medical history. History reviewed. No pertinent surgical history. History reviewed. No pertinent family history. Social History:  reports that she does not drink alcohol and does not use drugs. No history on file for tobacco use. Allergies: No Known Allergies Medications Prior to Admission  Medication Sig Dispense Refill   amoxicillin (AMOXIL) 875 MG tablet Take 875 mg by mouth 2 (two) times daily.     aspirin EC 81 MG tablet Take 81 mg by mouth daily.     Cholecalciferol (VITAMIN D3 PO) Take 1 tablet by mouth daily.     famotidine (PEPCID) 20 MG tablet Take 20 mg by mouth daily.     gabapentin (NEURONTIN) 100 MG capsule Take 1-3 capsules by mouth 2 (two) times daily as needed (for pain).     PARoxetine (PAXIL) 20 MG tablet Take 20 mg by mouth daily.        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 (?)  Lives With: Spouse   Functional History: Prior Function Prior Level of Function : Independent/Modified Independent, Driving Mobility Comments: No DME ADLs Comments: Indep  Functional Status:  Mobility: Bed Mobility Overal bed mobility: Needs Assistance Bed Mobility: Supine to Sit Rolling: Supervision Sidelying to sit: Used rails, Mod assist Supine to sit: Contact  guard, HOB elevated, Used rails Sit to supine: Min assist General bed mobility comments: CGA for safety, cues to scoot fully out to EOB Transfers Overall transfer level: Needs assistance Equipment used: Rolling walker (2 wheels) Transfers: Sit to/from Stand, Bed to chair/wheelchair/BSC Sit to Stand: Min assist Bed to/from chair/wheelchair/BSC transfer type:: Step pivot Step pivot transfers: Mod assist Transfer via Lift Equipment: [employer] transfer comment: min A to power up to stand with RW support from EOB x2 and recliner, mod A to step pivot EOB to chair with x1 LOB needing heavy mod to correct Ambulation/Gait Ambulation/Gait assistance: Mod assist Gait Distance (Feet): 3 Feet Assistive device: Rolling walker (2 wheels) Gait Pattern/deviations: Step-to pattern, Ataxic General Gait Details: pt able to take a few steps forward/back from chair    ADL: ADL Overall ADL's : Needs assistance/impaired Eating/Feeding: Minimal assistance, Sitting Eating/Feeding Details (indicate cue type and reason): Pt reported decrease in dropping items but does need some assist with BUE such as cutting Grooming: Contact guard assist, Wash/dry hands, Wash/dry face, Sitting, Bed level Upper Body Bathing: Minimal assistance, Sitting, Bed level Lower Body Bathing: Maximal assistance, Sitting/lateral leans Upper Body Dressing : Minimal assistance, Sitting, Bed level Lower Body Dressing: Maximal assistance, Sitting/lateral leans, Cueing for safety, Cueing for sequencing Toilet Transfer: Maximal assistance, +2 for physical assistance, +2 for safety/equipment, Squat-pivot Toileting- Clothing Manipulation and Hygiene: Moderate assistance, Cueing for safety, Cueing for sequencing, Bed level Toileting - Clothing Manipulation Details (indicate cue type and reason): rolling side to side General ADL Comments: Deffered for further sessions  Cognition: Cognition Overall Cognitive Status: Within Functional Limits  for tasks assessed Orientation Level: Oriented X4 Cognition Arousal: Alert Behavior During Therapy: WFL for tasks assessed/performed Overall Cognitive Status: Within Functional Limits for tasks assessed General Comments: pt highly motivated  Physical Exam: Blood pressure 120/82, pulse 93, temperature 98.4 F (36.9 C), temperature source Oral, resp. rate 17, height 5\' 6"  (1.676 m), weight 73.5 kg, last menstrual period 10/19/2008, SpO2 95%. Physical Exam Vitals and nursing note reviewed.  Constitutional:      General: She is not in acute distress.    Appearance: Normal appearance. She is normal weight.     Comments: Sitting up in bedside chair; awake, alert, appropriate, NAD; drinking water  HENT:     Head: Normocephalic and atraumatic.     Comments: Face symmetrical Tongue midline Intact to light touch on face No lid lag anymore    Right Ear: External ear normal.     Left Ear: External ear normal.     Nose: Nose normal.     Mouth/Throat:     Mouth: Mucous membranes are moist.     Pharynx: Oropharynx is clear. No oropharyngeal exudate.  Eyes:     General:        Right eye: No discharge.        Left eye: No discharge.     Extraocular Movements: Extraocular movements intact.  Cardiovascular:     Rate and Rhythm: Normal rate and regular rhythm.     Heart sounds: Normal heart sounds. No murmur heard.    No gallop.  Pulmonary:     Effort: Pulmonary effort is normal. No respiratory distress.     Breath sounds: Normal breath sounds. No wheezing, rhonchi or rales.  Abdominal:     General: Bowel  sounds are normal. There is no distension.     Palpations: Abdomen is soft.     Tenderness: There is no abdominal tenderness.  Genitourinary:    Comments: Purewick in place- medium almost medium beer color- thicker Musculoskeletal:     Cervical back: Neck supple. No tenderness.     Comments: Deltoids 4/5; Biceps 4+/5; Triceps 4/5; WE 5-/5; Grip 5-/5 and FA 4+/5 HF 4 to 4+/5; KE 4/5 KF  4+/5; DF 4+/5 and PF 5-/5 all B/L  Skin:    General: Skin is warm and dry.     Comments: Scattered erythematous patches on back Has R wrist iv- looks OK  Neurological:     Mental Status: She is alert and oriented to person, place, and time.     Comments: Intact to light touch in all 4 extremities Ox3 Absent DTRs  Psychiatric:        Mood and Affect: Mood normal.        Behavior: Behavior normal.     Results for orders placed or performed during the hospital encounter of 08/06/23 (from the past 48 hour(s))  CBC     Status: Abnormal   Collection Time: 08/11/23  5:22 AM  Result Value Ref Range   WBC 4.2 4.0 - 10.5 K/uL   RBC 4.59 3.87 - 5.11 MIL/uL   Hemoglobin 11.8 (L) 12.0 - 15.0 g/dL   HCT 16.1 09.6 - 04.5 %   MCV 82.8 80.0 - 100.0 fL   MCH 25.7 (L) 26.0 - 34.0 pg   MCHC 31.1 30.0 - 36.0 g/dL   RDW 40.9 (H) 81.1 - 91.4 %   Platelets 264 150 - 400 K/uL   nRBC 0.0 0.0 - 0.2 %    Comment: Performed at [facility], [address] [place], [place] [address]  Basic metabolic panel     Status: Abnormal   Collection Time: 08/11/23  5:22 AM  Result Value Ref Range   Sodium 134 (L) 135 - 145 mmol/L   Potassium 3.3 (L) 3.5 - 5.1 mmol/L   Chloride 101 98 - 111 mmol/L   CO2 27 22 - 32 mmol/L   Glucose, Bld 97 70 - 99 mg/dL    Comment: Glucose reference range applies only to samples taken after fasting for at least 8 hours.   BUN 34 (H) 8 - 23 mg/dL   Creatinine, Ser 6.21 0.44 - 1.00 mg/dL   Calcium 8.8 (L) 8.9 - 10.3 mg/dL   GFR, Estimated >30 >86 mL/min    Comment: (NOTE) Calculated using the CKD-EPI Creatinine Equation (2021)    Anion gap 6 5 - 15    Comment: Performed at [facility], [address], [place], [place] [address]   No results found.    Blood pressure 120/82, pulse 93, temperature 98.4 F (36.9 C), temperature source Oral, resp. rate 17, height 5\' 6"  (1.676 m), weight 73.5 kg, last menstrual period 10/19/2008, SpO2 95%.  Medical Problem List  and Plan: 1. 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  Antithrombotics: -DVT/anticoagulation:  Pharmaceutical: Lovenox  -antiplatelet therapy: Aspirin 81 mg  3. Pain Management: Tylenol, Robaxin, oxycodone as needed  4. Mood/Behavior/Sleep: LCSW to evaluate and provide emotional support  -continue Paxil   -antipsychotic agents: n/a  5. Neuropsych/cognition: This patient is capable of making decisions on her own behalf.  6. Skin/Wound Care: Routine skin care checks  -skin rash on back>>Sarna  7. Fluids/Electrolytes/Nutrition: Routine Is and Os and follow-up chemistries  -continue B12 supplementation  8: Hypertension: monitor TID and prn  -continue amlodipine 10 mg daily  -continue losartan 50 mg daily  10: Hypokalemia: received 40 mEq today -recheck BMP in am  11: Elevated BUN: encourage oral hydration and follow-up BMP in am- was drinking Coke - switched to water         Milinda Antis, PA-C 08/12/2023   I have personally performed a face to face diagnostic evaluation of this patient and formulated the key components of the plan.  Additionally, I have personally reviewed laboratory data, imaging studies, as well as relevant notes and concur with the physician assistant's documentation above.   The patient's status has not changed from the original H&P.  Any changes in documentation from the acute care chart have been noted above.

## 2023-08-12 NOTE — Progress Notes (Signed)
Inpatient Rehab Admissions Coordinator:   Following for my colleague Megan Salon.  We did receive insurance approval for CIR this morning, unfortunately I do not have a bed available for her today.  We will follow.   Estill Dooms, PT, DPT Admissions Coordinator [phone] 08/12/23  10:05 AM

## 2023-08-12 NOTE — Plan of Care (Signed)

## 2023-08-12 NOTE — Discharge Summary (Signed)
Physician Discharge Summary  Jill Allison WUJ:[id number] DOB: 10-13-1961 DOA: 08/06/2023  PCP: Mila Palmer, MD  Admit date: 08/06/2023 Discharge date: 08/12/2023  Admitted From: Home Disposition:  CIR  Discharge Condition:Stable CODE STATUS:FULL Diet recommendation: Heart Healthy   Brief/Interim Summary: Patient is a 62 year old female with history of anxiety who presented with multiple symptoms including fatigue, generalized pain and bodyaches, numbness/tingling on her hands and feet.  She recently had COVID infection about 3 weeks ago.  On presentation, she was hemodynamically stable but mildly hypertensive.  Vitamin B12 was found to be 200.  LP was performed which showed elevated albumin, low suspicion for meningitis/encephalitis.  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  Started on IVIG,completed 5 days course.    PT/OT recommending acute inpatient rehab on discharge.  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  Recent history of COVID.  Also had mild weakness in bilateral lower extremities.  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  Neurology was consulted.  Started on IVIG.  Completed 5 days course Acetylcholine receptor negative, MuSK  Ab pending NIF's and vital capacity were checked and the remained stable. Consulted PT and OT, recommendation is AIR. Neurology recommending to follow-up with neuromuscular neurology for possible EMG/NCS  in 4 to 6 weeks.Does not have focal deficits but was ataxic, needing assistance with ambulation.  Now overall improving.   Vitamin B12 deficiency: Continue supplementation.  Methylmalonic acid level high    Hypertension: Does not take any medication at home.  Noted to be hypertensive here.  Started  on amlodipine, losartan here .blood pressure better today   Hypokalemia: Supplemented potassium   Discharge Diagnoses:  Principal Problem:   Ataxia Active Problems:   Paresthesias   Known medical problems    Discharge Instructions  Discharge Instructions     Ambulatory referral to Neurology   Complete by: As directed    An appointment is requested in approximately: 4 weeks   Diet - low sodium heart healthy   Complete by: As directed    Discharge instructions   Complete by: As directed    1)Please follow up with neurology as an outpatient   Increase activity slowly   Complete by: As directed       Allergies as of 08/12/2023   No Known Allergies      Medication List     STOP taking these medications    amoxicillin 875 MG tablet Commonly known as: AMOXIL       TAKE these medications    amLODipine 10 MG tablet Commonly known as: NORVASC Take 1 tablet (10 mg total) by mouth daily. Start taking on: August 13, 2023   aspirin EC 81 MG tablet Take 81 mg by mouth daily.   cyanocobalamin 1000 MCG tablet Take 1 tablet (1,000 mcg total) by mouth daily. Start taking on: August 13, 2023   famotidine 20 MG tablet Commonly known as: PEPCID Take 20 mg by mouth daily.   gabapentin 100 MG capsule Commonly known as: NEURONTIN Take 1-3 capsules by mouth 2 (two) times daily as needed (for pain).   losartan 50 MG tablet Commonly known as: COZAAR Take 1 tablet (50 mg total) by mouth daily. Start taking on: August 13, 2023   PARoxetine 20 MG tablet Commonly known as: PAXIL  Take 20 mg by mouth daily.   VITAMIN D3 PO Take 1 tablet by mouth daily.        Follow-up Information     [facility] Follow up in 4 week(s).   Contact information: [address]     [address] [place] [address] [phone]        [facility] .   Specialty: Neurology Contact information: [address] [place] [address] [phone]               No Known Allergies  Consultations: Neurology   Procedures/Studies: No results found.    Subjective: Patient seen and examined at bedside this mrng.Hemodynamically stable. Stable for dc to CIR  Discharge Exam: Vitals:   08/12/23 0717 08/12/23 0927  BP: (!) 140/77 120/82  Pulse: 93 93  Resp: 17   Temp: 98.4 F (36.9 C)   SpO2: 95%    Vitals:   08/11/23 2352 08/12/23 0500 08/12/23 0717 08/12/23 0927  BP: 136/75 127/81 (!) 140/77 120/82  Pulse: (!) 102 93 93 93  Resp: 18  17   Temp: 98.5 F (36.9 C) 98 F (36.7 C) 98.4 F (36.9 C)   TempSrc: Oral Oral Oral   SpO2: 93% 95% 95%   Weight:      Height:        General: Pt is alert, awake, not in acute distress Cardiovascular: RRR, S1/S2 +, no rubs, no gallops Respiratory: CTA bilaterally, no wheezing, no rhonchi Abdominal: Soft, NT, ND, bowel sounds + Extremities: no edema, no cyanosis    The results of significant diagnostics from this hospitalization (including imaging, microbiology, ancillary and laboratory) are listed below for reference.     Microbiology: Recent Results (from the past 240 hour(s))  CSF culture w Gram Stain     Status: None   Collection Time: 08/06/23  1:30 PM   Specimen: CSF; Cerebrospinal Fluid  Result Value Ref Range Status   Specimen Description   Final    CSF Performed at [facility], [address] [place], [place] [address]    Special Requests   Final    NONE Performed at [facility], [address] [place], [place] [address]    Gram Stain NO WBC SEEN NO ORGANISMS SEEN   Final   Culture   Final    NO GROWTH 3 DAYS Performed at [facility], [address], [place], [place] [address]    Report Status 08/10/2023 FINAL  Final     Labs: BNP (last 3 results) No  results for input(s): "BNP" in the last 8760 hours. Basic Metabolic Panel: Recent Labs  Lab 08/06/23 0850 08/07/23 0628 08/11/23 0522  NA 141 139 134*  K 4.2 4.1 3.3*  CL 103 103 101  CO2 31 28 27   GLUCOSE 103* 99 97  BUN 17 16 34*  CREATININE 0.81 0.73 0.67  CALCIUM 9.5 9.3 8.8*  MG 2.0  --   --    Liver Function Tests: Recent Labs  Lab 08/06/23 0850 08/07/23 0628  AST 18 21  ALT 15 18  ALKPHOS 90 73  BILITOT 0.5 0.6  PROT 8.2* 7.3  ALBUMIN 4.2 3.6   Recent Labs  Lab 08/06/23 0850  LIPASE 29   No results for input(s): "AMMONIA" in the last 168 hours. CBC: Recent Labs  Lab 08/06/23 0850 08/07/23 0628 08/11/23 0522  WBC 6.1 5.8 4.2  HGB 12.3 11.9*  11.8*  HCT 39.6 38.7 38.0  MCV 83.5 82.0 82.8  PLT 330 319 264   Cardiac Enzymes: No results for input(s): "CKTOTAL", "CKMB", "CKMBINDEX", "TROPONINI" in the last 168 hours. BNP: Invalid input(s): "POCBNP" CBG: No results for input(s): "GLUCAP" in the last 168 hours. D-Dimer No results for input(s): "DDIMER" in the last 72 hours. Hgb A1c No results for input(s): "HGBA1C" in the last 72 hours. Lipid Profile No results for input(s): "CHOL", "HDL", "LDLCALC", "TRIG", "CHOLHDL", "LDLDIRECT" in the last 72 hours. Thyroid function studies No results for input(s): "TSH", "T4TOTAL", "T3FREE", "THYROIDAB" in the last 72 hours.  Invalid input(s): "FREET3" Anemia work up No results for input(s): "VITAMINB12", "FOLATE", "FERRITIN", "TIBC", "IRON", "RETICCTPCT" in the last 72 hours. Urinalysis    Component Value Date/Time   LABSPEC 1.016 04/01/2012 2043   PHURINE 8.5 (H) 04/01/2012 2043   GLUCOSEU NEGATIVE 04/01/2012 2043   HGBUR NEGATIVE 04/01/2012 2043   BILIRUBINUR NEGATIVE 04/01/2012 2043   KETONESUR 15 (A) 04/01/2012 2043   PROTEINUR NEGATIVE 04/01/2012 2043   UROBILINOGEN 0.2 04/01/2012 2043   NITRITE NEGATIVE 04/01/2012 2043   LEUKOCYTESUR NEGATIVE 04/01/2012 2043   Sepsis Labs Recent Labs  Lab  08/06/23 0850 08/07/23 0628 08/11/23 0522  WBC 6.1 5.8 4.2   Microbiology Recent Results (from the past 240 hour(s))  CSF culture w Gram Stain     Status: None   Collection Time: 08/06/23  1:30 PM   Specimen: CSF; Cerebrospinal Fluid  Result Value Ref Range Status   Specimen Description   Final    CSF Performed at [facility], [address], [place], [place] [address]    Special Requests   Final    NONE Performed at [facility], [address] [place], [place] [address]    Gram Stain NO WBC SEEN NO ORGANISMS SEEN   Final   Culture   Final    NO GROWTH 3 DAYS Performed at [facility], [address] [place], [place] [address]    Report Status 08/10/2023 FINAL  Final    Please note: You were cared for by a hospitalist during your hospital stay. Once you are discharged, your primary care physician will handle any further medical issues. Please note that NO REFILLS for any discharge medications will be authorized once you are discharged, as it is imperative that you return to your primary care physician (or establish a relationship with a primary care physician if you do not have one) for your post hospital discharge needs so that they can reassess your need for medications and monitor your lab values.    Time coordinating discharge: 40 minutes  SIGNED:   Burnadette Pop, MD  [facility] 08/12/2023, 3:16 PM Pager [phone]  If 7PM-7AM, please contact night-coverage [email or web address] [facility] Physician Discharge Summary  SURIA DEW MWN:[id number] DOB: 01-08-1961 DOA: 08/06/2023  PCP: Mila Palmer, MD  Admit date: 08/06/2023 Discharge date: 08/12/2023  Admitted From: Home Disposition:  Home  Discharge Condition:Stable CODE STATUS:FULL, DNR, Comfort Care Diet recommendation: Heart Healthy / Carb Modified / Regular / Dysphagia   Brief/Interim Summary:   Following problems were addressed during the  hospitalization:   Discharge Diagnoses:  Principal Problem:   Ataxia Active Problems:   Paresthesias   Known medical problems    Discharge Instructions  Discharge Instructions     Ambulatory referral to Neurology   Complete by: As directed    An appointment is requested in approximately: 4 weeks   Diet -  low sodium heart healthy   Complete by: As directed    Discharge instructions   Complete by: As directed    1)Please follow up with neurology as an outpatient   Increase activity slowly   Complete by: As directed       Allergies as of 08/12/2023   No Known Allergies      Medication List     STOP taking these medications    amoxicillin 875 MG tablet Commonly known as: AMOXIL       TAKE these medications    amLODipine 10 MG tablet Commonly known as: NORVASC Take 1 tablet (10 mg total) by mouth daily. Start taking on: August 13, 2023   aspirin EC 81 MG tablet Take 81 mg by mouth daily.   cyanocobalamin 1000 MCG tablet Take 1 tablet (1,000 mcg total) by mouth daily. Start taking on: August 13, 2023   famotidine 20 MG tablet Commonly known as: PEPCID Take 20 mg by mouth daily.   gabapentin 100 MG capsule Commonly known as: NEURONTIN Take 1-3 capsules by mouth 2 (two) times daily as needed (for pain).   losartan 50 MG tablet Commonly known as: COZAAR Take 1 tablet (50 mg total) by mouth daily. Start taking on: August 13, 2023   PARoxetine 20 MG tablet Commonly known as: PAXIL Take 20 mg by mouth daily.   VITAMIN D3 PO Take 1 tablet by mouth daily.        Follow-up Information     [facility] Follow up in 4 week(s).   Contact information: [address]     [address] [place] [address] [phone]        [facility] .   Specialty: Neurology Contact information: [address] [place] [address] [phone]                No Known Allergies  Consultations:    Procedures/Studies: No results found.    Subjective:   Discharge Exam: Vitals:   08/12/23 0717 08/12/23 0927  BP: (!) 140/77 120/82  Pulse: 93 93  Resp: 17   Temp: 98.4 F (36.9 C)   SpO2: 95%    Vitals:   08/11/23 2352 08/12/23 0500 08/12/23 0717 08/12/23 0927  BP: 136/75 127/81 (!) 140/77 120/82  Pulse: (!) 102 93 93 93  Resp: 18  17   Temp: 98.5 F (36.9 C) 98 F (36.7 C) 98.4 F (36.9 C)   TempSrc: Oral Oral Oral   SpO2: 93% 95% 95%   Weight:      Height:        General: Pt is alert, awake, not in acute distress Cardiovascular: RRR, S1/S2 +, no rubs, no gallops Respiratory: CTA bilaterally, no wheezing, no rhonchi Abdominal: Soft, NT, ND, bowel sounds + Extremities: no edema, no cyanosis    The results of significant diagnostics from this hospitalization (including imaging, microbiology, ancillary and laboratory) are listed below for reference.     Microbiology: Recent Results (from the past 240 hour(s))  CSF culture w Gram Stain     Status: None   Collection Time: 08/06/23  1:30 PM   Specimen: CSF; Cerebrospinal Fluid  Result Value Ref Range Status   Specimen Description   Final    CSF Performed at [facility], [address], [place], [place] [address]    Special Requests   Final    NONE Performed at [facility], [address]  [place], [place] [address]    Gram Stain NO WBC SEEN NO ORGANISMS SEEN   Final   Culture   Final    NO GROWTH 3 DAYS Performed at [facility], [address] [place], [place] [address]    Report Status 08/10/2023 FINAL  Final     Labs: BNP (last 3 results) No results for input(s): "BNP" in the last 8760 hours. Basic Metabolic Panel: Recent Labs  Lab 08/06/23 0850 08/07/23 0628 08/11/23 0522  NA 141 139 134*  K 4.2 4.1 3.3*  CL 103 103 101  CO2 31 28 27   GLUCOSE 103* 99 97  BUN 17 16 34*  CREATININE 0.81 0.73 0.67   CALCIUM 9.5 9.3 8.8*  MG 2.0  --   --    Liver Function Tests: Recent Labs  Lab 08/06/23 0850 08/07/23 0628  AST 18 21  ALT 15 18  ALKPHOS 90 73  BILITOT 0.5 0.6  PROT 8.2* 7.3  ALBUMIN 4.2 3.6   Recent Labs  Lab 08/06/23 0850  LIPASE 29   No results for input(s): "AMMONIA" in the last 168 hours. CBC: Recent Labs  Lab 08/06/23 0850 08/07/23 0628 08/11/23 0522  WBC 6.1 5.8 4.2  HGB 12.3 11.9* 11.8*  HCT 39.6 38.7 38.0  MCV 83.5 82.0 82.8  PLT 330 319 264   Cardiac Enzymes: No results for input(s): "CKTOTAL", "CKMB", "CKMBINDEX", "TROPONINI" in the last 168 hours. BNP: Invalid input(s): "POCBNP" CBG: No results for input(s): "GLUCAP" in the last 168 hours. D-Dimer No results for input(s): "DDIMER" in the last 72 hours. Hgb A1c No results for input(s): "HGBA1C" in the last 72 hours. Lipid Profile No results for input(s): "CHOL", "HDL", "LDLCALC", "TRIG", "CHOLHDL", "LDLDIRECT" in the last 72 hours. Thyroid function studies No results for input(s): "TSH", "T4TOTAL", "T3FREE", "THYROIDAB" in the last 72 hours.  Invalid input(s): "FREET3" Anemia work up No results for input(s): "VITAMINB12", "FOLATE", "FERRITIN", "TIBC", "IRON", "RETICCTPCT" in the last 72 hours. Urinalysis    Component Value Date/Time   LABSPEC 1.016 04/01/2012 2043   PHURINE 8.5 (H) 04/01/2012 2043   GLUCOSEU NEGATIVE 04/01/2012 2043   HGBUR NEGATIVE 04/01/2012 2043   BILIRUBINUR NEGATIVE 04/01/2012 2043   KETONESUR 15 (A) 04/01/2012 2043   PROTEINUR NEGATIVE 04/01/2012 2043   UROBILINOGEN 0.2 04/01/2012 2043   NITRITE NEGATIVE 04/01/2012 2043   LEUKOCYTESUR NEGATIVE 04/01/2012 2043   Sepsis Labs Recent Labs  Lab 08/06/23 0850 08/07/23 0628 08/11/23 0522  WBC 6.1 5.8 4.2   Microbiology Recent Results (from the past 240 hour(s))  CSF culture w Gram Stain     Status: None   Collection Time: 08/06/23  1:30 PM   Specimen: CSF; Cerebrospinal Fluid  Result Value Ref Range Status    Specimen Description   Final    CSF Performed at [facility], [address], [place], [place] [address]    Special Requests   Final    NONE Performed at [facility], [address], [place], [place] [address]    Gram Stain NO WBC SEEN NO ORGANISMS SEEN   Final   Culture   Final    NO GROWTH 3 DAYS Performed at [facility], [address], [place], [place] [address]    Report Status 08/10/2023 FINAL  Final    Please note: You were cared for by a hospitalist during your hospital stay. Once you are discharged, your primary care physician will handle any further medical issues. Please note  that NO REFILLS for any discharge medications will be authorized once you are discharged, as it is imperative that you return to your primary care physician (or establish a relationship with a primary care physician if you do not have one) for your post hospital discharge needs so that they can reassess your need for medications and monitor your lab values.    Time coordinating discharge: 40 minutes  SIGNED:   Burnadette Pop, MD  [facility] 08/12/2023, 3:16 PM Pager [phone]  If 7PM-7AM, please contact night-coverage [email or web address] Password [email or web address]

## 2023-08-12 NOTE — Progress Notes (Signed)
Inpatient Rehab Admissions Coordinator:   I have a bed for this patient to admit to CIR today.  Dr. Renford Dills in agreement.  Will let pt/family know, TOC aware.   Estill Dooms, PT, DPT Admissions Coordinator [phone] 08/12/23  3:09 PM

## 2023-08-12 NOTE — Progress Notes (Signed)
Physical Therapy Treatment Patient Details Name: Jill Allison MRN: [id number] DOB: 11-02-1960 Today's Date: 08/12/2023   History of Present Illness 62 y.o. female admitted to [facility] on 08/06/23 due to fatigue, generalized pain and bodyaches, numbness/tingling in hands and feet. 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 Pt started on IVIG on 08/07/23 for a 5 day course. No PMH.    PT Comments  Pt greeted resting in bed, eager for OOB mobility, with pt demonstrating great progress this session. Pt requiring CGA for bed mobility and cues to scoot out to EOB with pt demonstrating increased trunk control in sitting. Pt able to come to stand x3 with min A to power up and steady in standing. Pt needing cues for bracing/engaging core/hip/glutes in standing as pt with noted trunk and hip instability with sway in all directions. Pt able to step pivot EOB>chair with mod A to maintain balance and RW for support and take a few steps forward/back with mod A to maintain balance. Pt continues to be limited by ataxia with all mobility and poor distal motor control. Current plan remains appropriate to address deficits and maximize functional independence and decrease caregiver burden. Pt continues to benefit from skilled PT services to progress toward functional mobility goals.     If plan is discharge home, recommend the following: Two people to help with bathing/dressing/bathroom;Two people to help with walking and/or transfers;Assist for transportation;Assistance with cooking/housework;Help with stairs or ramp for entrance   Can travel by private vehicle        Equipment Recommendations  Other (comment) (TBD)    Recommendations for Other Services       Precautions / Restrictions Precautions Precautions: Fall Precaution Comments: ataxia Restrictions Weight Bearing Restrictions: No     Mobility  Bed Mobility Overal bed mobility: Needs  Assistance Bed Mobility: Supine to Sit     Supine to sit: Contact guard, HOB elevated, Used rails     General bed mobility comments: CGA for safety, cues to scoot fully out to EOB    Transfers Overall transfer level: Needs assistance Equipment used: Rolling walker (2 wheels) Transfers: Sit to/from Stand, Bed to chair/wheelchair/BSC Sit to Stand: Min assist   Step pivot transfers: Mod assist       General transfer comment: min A to power up to stand with RW support from EOB x2 and recliner, mod A to step pivot EOB to chair with x1 LOB needing heavy mod to correct    Ambulation/Gait Ambulation/Gait assistance: Mod assist Gait Distance (Feet): 3 Feet Assistive device: Rolling walker (2 wheels) Gait Pattern/deviations: Step-to pattern, Ataxic       General Gait Details: pt able to take a few steps forward/back from chair   Stairs             Wheelchair Mobility     Tilt Bed    Modified Rankin (Stroke Patients Only)       Balance Overall balance assessment: Needs assistance Sitting-balance support: Feet supported, Bilateral upper extremity supported Sitting balance-Leahy Scale: Fair Sitting balance - Comments: Pt was able to remain at midline but required BUE support   Standing balance support: Bilateral upper extremity supported Standing balance-Leahy Scale: Poor Standing balance comment: reliant on UE support and PRN min A to maintain static standing, mod A with dynamic tasks                            Cognition Arousal: Alert  Behavior During Therapy: WFL for tasks assessed/performed Overall Cognitive Status: Within Functional Limits for tasks assessed                                 General Comments: pt highly motivated        Exercises      General Comments General comments (skin integrity, edema, etc.): VSS on RA      Pertinent Vitals/Pain Pain Assessment Pain Assessment: Faces Faces Pain Scale: Hurts a little  bit Pain Location: hands and feet Pain Descriptors / Indicators: Tingling, Pins and needles Pain Intervention(s): Monitored during session, Limited activity within patient's tolerance    Home Living                          Prior Function            PT Goals (current goals can now be found in the care plan section) Acute Rehab PT Goals Patient Stated Goal: return home; more independent PT Goal Formulation: With patient/family Time For Goal Achievement: 08/20/23 Progress towards PT goals: Progressing toward goals    Frequency    Min 1X/week      PT Plan      Co-evaluation              AM-PAC PT "6 Clicks" Mobility   Outcome Measure  Help needed turning from your back to your side while in a flat bed without using bedrails?: A Little Help needed moving from lying on your back to sitting on the side of a flat bed without using bedrails?: A Little Help needed moving to and from a bed to a chair (including a wheelchair)?: A Lot Help needed standing up from a chair using your arms (e.g., wheelchair or bedside chair)?: A Little Help needed to walk in hospital room?: Total Help needed climbing 3-5 steps with a railing? : Total 6 Click Score: 13    End of Session Equipment Utilized During Treatment: Gait belt Activity Tolerance: Patient tolerated treatment well Patient left: with call bell/phone within reach;in chair Nurse Communication: Mobility status;Other (comment) (to contact this PTA (cell # provided)  if need help returning to bed) PT Visit Diagnosis: Other abnormalities of gait and mobility (R26.89);Muscle weakness (generalized) (M62.81);Ataxic gait (R26.0);Other symptoms and signs involving the nervous system (R29.898);Unsteadiness on feet (R26.81)     Time: 4742-5956 PT Time Calculation (min) (ACUTE ONLY): 19 min  Charges:    $Therapeutic Activity: 8-22 mins PT General Charges $$ ACUTE PT VISIT: 1 Visit                     Boy Delamater R.  PTA Acute Rehabilitation Services Office: [phone]   Catalina Antigua 08/12/2023, 12:53 PM

## 2023-08-12 NOTE — Progress Notes (Addendum)
PROGRESS NOTE  Jill Allison  MVH:[id number] DOB: [birth year] DOA: 08/06/2023 PCP: Mila Palmer, MD   Brief Narrative: Patient is a 62 year old female with history of anxiety who presented with multiple symptoms including fatigue, generalized pain and bodyaches, numbness/tingling on her hands and feet.  She recently had COVID infection about 3 weeks ago.  On presentation, she was hemodynamically stable but mildly hypertensive.  Vitamin B12 was found to be 200.  LP was performed which showed elevated albumin, low suspicion for meningitis/encephalitis.  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  Started on IVIG,completed 5 days course.    PT/OT recommending acute inpatient rehab on discharge.  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  Recent history of COVID.  Also had mild weakness in bilateral lower extremities.  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  Neurology was consulted.  Started on IVIG.  Completed 5 days course Acetylcholine receptor negative, MuSK  Ab pending NIF's and vital capacity were checked and the remained stable. Consulted PT and OT, recommendation is AIR. Neurology recommending to follow-up with neuromuscular neurology for possible EMG/NCS  in 4 to 6 weeks.Does not have focal deficits but was ataxic, needing assistance with ambulation.  Now overall improving.  Vitamin B12 deficiency: Continue supplementation.  Methylmalonic acid level high  Hypertension: Does not take any medication at home.  Noted to be hypertensive here.  Started  on amlodipine, losartan here .blood pressure better  today  Hypokalemia: Supplemented potassium       DVT prophylaxis:enoxaparin (LOVENOX) injection 40 mg Start: 08/08/23 1200Lovenox     Code Status: Full Code  Family Communication: Discussed with husband at bedside on 10/19  Patient status:obs  Patient is from : Home  Anticipated discharge to:CIR  Estimated DC date: Waiting on bed availability at [facility]   Consultants: Neurology  Procedures: None  Antimicrobials:  Anti-infectives (From admission, onward)    None       Subjective: Patient seen and examined at bedside today.  Hemodynamically stable.  Lying comfortably on the bed.  She says her numbness and tingling on hands and face are being better every day.  Objective: Vitals:   08/11/23 2352 08/12/23 0500 08/12/23 0717 08/12/23 0927  BP: 136/75 127/81 (!) 140/77 120/82  Pulse: (!) 102 93 93 93  Resp: 18  17   Temp: 98.5 F (36.9 C) 98 F (36.7 C) 98.4 F (36.9 C)   TempSrc: Oral Oral Oral   SpO2: 93% 95% 95%   Weight:      Height:       No intake or output data in the 24 hours ending 08/12/23 1110  Filed Weights   08/06/23 0722  Weight: 73.5 kg    Examination:  General exam: Overall comfortable, not in distress HEENT: PERRL Respiratory system:  no wheezes or crackles  Cardiovascular system: S1 & S2 heard, RRR.  Gastrointestinal system: Abdomen is nondistended, soft and nontender. Central nervous system: Alert and oriented Extremities: No edema, no clubbing ,no cyanosis Skin: No rashes, no ulcers,no icterus      Data Reviewed: I have personally reviewed following labs and imaging studies  CBC: Recent Labs  Lab 08/06/23 0850 08/07/23 0628 08/11/23 0522  WBC 6.1 5.8 4.2  HGB 12.3 11.9* 11.8*  HCT 39.6 38.7 38.0  MCV 83.5 82.0 82.8  PLT 330 319 264   Basic Metabolic Panel: Recent Labs  Lab 08/06/23 0850 08/07/23 0628 08/11/23 0522  NA 141 139 134*  K 4.2 4.1 3.3*  CL 103 103 101  CO2 31 28 27   GLUCOSE 103* 99 97  BUN 17 16 34*   CREATININE 0.81 0.73 0.67  CALCIUM 9.5 9.3 8.8*  MG 2.0  --   --      Recent Results (from the past 240 hour(s))  CSF culture w Gram Stain     Status: None   Collection Time: 08/06/23  1:30 PM   Specimen: CSF; Cerebrospinal Fluid  Result Value Ref Range Status   Specimen Description   Final    CSF Performed at [facility], [address] [place], [place] [address]    Special Requests   Final    NONE Performed at [facility], [address] [place], [place] [address]    Gram Stain NO WBC SEEN NO ORGANISMS SEEN   Final   Culture   Final    NO GROWTH 3 DAYS Performed at [facility], [address], [place], [place] [address]    Report Status 08/10/2023 FINAL  Final     Radiology Studies: No results found.  Scheduled Meds:  amLODipine  10 mg Oral Daily   aspirin EC  81 mg Oral Daily   vitamin B-12  1,000 mcg Oral Daily   enoxaparin (LOVENOX) injection  40 mg Subcutaneous Q24H   famotidine  20 mg Oral Daily   losartan  50 mg Oral Daily   PARoxetine  20 mg Oral Daily   sodium chloride flush  3 mL Intravenous Q12H   Continuous Infusions:     LOS: 5 days   Burnadette Pop, MD [facility] P10/24/2024, 11:10 AM

## 2023-08-12 NOTE — H&P (Signed)
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     HPI: Jill Allison is a 62 year old female who presented to department on 08/06/2023 complaining of increasing pain and weakness in the extremities bilaterally.  She reported a history of COVID infection approximately 2 weeks prior to admission.  She was recently treated for upper respiratory infection and placed on antibiotics.  On presentation, she described pain in both feet, numbness and tingling in both hands.  Vitamin B12 level 200.  Lumbar puncture was performed which showed elevated albumin, low suspicion for meningitis/encephalitis.  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  She was started on IVIG and completed 5-day course.  Over the course of her hospitalization, she was noted to have elevated blood pressure readings and started on amlodipine and losartan. Has undergone PT/OT evals. The patient requires inpatient medicine and rehabilitation evaluations and services for ongoing dysfunction secondary to suspected AIDP.   Her LBM was this AM- usually takes Colace daily at home for constipation.  Using Purewick- admits urine is dark due to drinking a lot of coke up til now - doesn't like water, but drinking right now. "Nothing quenching her thirst".  Does note had COVID 3 weeks ago- 2 weeks prior to admission. No vaccines.  More strength today however notes fell using Steady a few days ago.  .        Review of Systems  Constitutional:  Negative for chills and fever.  Respiratory:  Positive for cough. Negative for shortness of breath.        C/o intermittent dry cough  Cardiovascular:  Negative for chest pain and palpitations.  Gastrointestinal:  Negative for constipation, nausea and vomiting.  Genitourinary:  Negative for dysuria and urgency.   Musculoskeletal:  Negative for back pain and neck pain.  Neurological:  Negative for dizziness and headaches.  Psychiatric/Behavioral:  Negative for depression. The patient is not nervous/anxious.   All other systems reviewed and are negative.   History reviewed. No pertinent past medical history.     History reviewed. No pertinent surgical history.     History reviewed. No pertinent family history.     Social History:  reports that she does not drink alcohol and does not use drugs. No history on file for tobacco use. Allergies:  Allergies  No Known Allergies         Medications Prior to Admission  Medication Sig Dispense Refill   amoxicillin (AMOXIL) 875 MG tablet Take 875 mg by mouth 2 (two) times daily.       aspirin EC 81 MG tablet Take 81 mg by mouth daily.       Cholecalciferol (VITAMIN D3 PO) Take 1 tablet by mouth daily.       famotidine (PEPCID) 20 MG tablet Take 20 mg by mouth daily.       gabapentin (NEURONTIN) 100 MG capsule Take 1-3 capsules by mouth 2 (two) times daily as needed (for pain).       PARoxetine (PAXIL) 20 MG tablet Take 20 mg by mouth daily.                  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 (?)  Lives With: Spouse   Functional History: Prior Function Prior Level of Function : Independent/Modified Independent, Driving Mobility Comments: No DME ADLs Comments: Indep   Functional Status:  Mobility: Bed Mobility Overal bed mobility: Needs Assistance Bed Mobility: Supine to  Sit Rolling: Supervision Sidelying to sit: Used rails, Mod assist Supine to sit: Contact guard, HOB elevated, Used rails Sit to supine: Min assist General bed mobility comments: CGA for safety, cues to scoot fully out to EOB Transfers Overall transfer level: Needs assistance Equipment used: Rolling walker (2 wheels) Transfers: Sit to/from Stand, Bed to chair/wheelchair/BSC Sit to Stand: Min assist Bed to/from chair/wheelchair/BSC transfer type:: Step pivot Step pivot transfers: Mod assist Transfer via Lift Equipment: [employer] transfer comment: min A to power up to stand with RW support from EOB x2 and recliner, mod A to step pivot EOB to chair with x1 LOB needing heavy mod to correct Ambulation/Gait Ambulation/Gait assistance: Mod assist Gait Distance (Feet): 3 Feet Assistive device: Rolling walker (2 wheels) Gait Pattern/deviations: Step-to pattern, Ataxic General Gait Details: pt able to take a few steps forward/back from chair   ADL: ADL Overall ADL's : Needs assistance/impaired Eating/Feeding: Minimal assistance, Sitting Eating/Feeding Details (indicate cue type and reason): Pt reported decrease in dropping items but does need some assist with BUE such as cutting Grooming: Contact guard assist, Wash/dry hands, Wash/dry face, Sitting, Bed level Upper Body Bathing: Minimal assistance, Sitting, Bed level Lower Body Bathing: Maximal assistance, Sitting/lateral leans Upper Body Dressing : Minimal assistance, Sitting, Bed level Lower Body Dressing: Maximal assistance, Sitting/lateral leans, Cueing for safety, Cueing for sequencing Toilet Transfer: Maximal assistance, +2 for physical assistance, +2 for safety/equipment, Squat-pivot Toileting- Clothing Manipulation and Hygiene: Moderate assistance, Cueing for safety, Cueing for sequencing, Bed level Toileting - Clothing Manipulation Details (indicate cue type and reason): rolling side to side General ADL Comments: Deffered for  further sessions   Cognition: Cognition Overall Cognitive Status: Within Functional Limits for tasks assessed Orientation Level: Oriented X4 Cognition Arousal: Alert Behavior During Therapy: WFL for tasks assessed/performed Overall Cognitive Status: Within Functional Limits for tasks assessed General Comments: pt highly motivated   Physical Exam: Blood pressure 120/82, pulse 93, temperature 98.4 F (36.9 C), temperature source Oral, resp. rate 17, height 5\' 6"  (1.676 m), weight 73.5 kg, last menstrual period 10/19/2008, SpO2 95%. Physical Exam Vitals and nursing note reviewed.  Constitutional:      General: She is not in acute distress.    Appearance: Normal appearance. She is normal weight.     Comments: Sitting up in bedside chair; awake, alert, appropriate, NAD; drinking water  HENT:     Head: Normocephalic and atraumatic.     Comments: Face symmetrical Tongue midline Intact to light touch on face No lid lag anymore    Right Ear: External ear normal.     Left Ear: External ear normal.     Nose: Nose normal.     Mouth/Throat:     Mouth: Mucous membranes are moist.     Pharynx: Oropharynx is clear. No oropharyngeal exudate.  Eyes:     General:        Right eye: No discharge.        Left eye: No discharge.     Extraocular Movements: Extraocular movements intact.  Cardiovascular:     Rate and Rhythm: Normal rate and regular  rhythm.     Heart sounds: Normal heart sounds. No murmur heard.    No gallop.  Pulmonary:     Effort: Pulmonary effort is normal. No respiratory distress.     Breath sounds: Normal breath sounds. No wheezing, rhonchi or rales.  Abdominal:     General: Bowel sounds are normal. There is no distension.     Palpations: Abdomen is soft.     Tenderness: There is no abdominal tenderness.  Genitourinary:    Comments: Purewick in place- medium almost medium beer color- thicker Musculoskeletal:     Cervical back: Neck supple. No tenderness.     Comments:  Deltoids 4/5; Biceps 4+/5; Triceps 4/5; WE 5-/5; Grip 5-/5 and FA 4+/5 HF 4 to 4+/5; KE 4/5 KF 4+/5; DF 4+/5 and PF 5-/5 all B/L  Skin:    General: Skin is warm and dry.     Comments: Scattered erythematous patches on back Has R wrist iv- looks OK  Neurological:     Mental Status: She is alert and oriented to person, place, and time.     Comments: Intact to light touch in all 4 extremities Ox3 Absent DTRs  Psychiatric:        Mood and Affect: Mood normal.        Behavior: Behavior normal.        Lab Results Last 48 Hours        Results for orders placed or performed during the hospital encounter of 08/06/23 (from the past 48 hour(s))  CBC     Status: Abnormal    Collection Time: 08/11/23  5:22 AM  Result Value Ref Range    WBC 4.2 4.0 - 10.5 K/uL    RBC 4.59 3.87 - 5.11 MIL/uL    Hemoglobin 11.8 (L) 12.0 - 15.0 g/dL    HCT 16.1 09.6 - 04.5 %    MCV 82.8 80.0 - 100.0 fL    MCH 25.7 (L) 26.0 - 34.0 pg    MCHC 31.1 30.0 - 36.0 g/dL    RDW 40.9 (H) 81.1 - 15.5 %    Platelets 264 150 - 400 K/uL    nRBC 0.0 0.0 - 0.2 %      Comment: Performed at [facility], [address], [place], [place] [address]  Basic metabolic panel     Status: Abnormal    Collection Time: 08/11/23  5:22 AM  Result Value Ref Range    Sodium 134 (L) 135 - 145 mmol/L    Potassium 3.3 (L) 3.5 - 5.1 mmol/L    Chloride 101 98 - 111 mmol/L    CO2 27 22 - 32 mmol/L    Glucose, Bld 97 70 - 99 mg/dL      Comment: Glucose reference range applies only to samples taken after fasting for at least 8 hours.    BUN 34 (H) 8 - 23 mg/dL    Creatinine, Ser 2.95 0.44 - 1.00 mg/dL    Calcium 8.8 (L) 8.9 - 10.3 mg/dL    GFR, Estimated >62 >13 mL/min      Comment: (NOTE) Calculated using the CKD-EPI Creatinine Equation (2021)      Anion gap 6 5 - 15      Comment: Performed at [facility], [address], [place], [place] [address]      Imaging Results (Last 48 hours)  No results found.          Blood pressure 120/82, pulse 93, temperature 98.4 F (  36.9 C), temperature source Oral, resp. rate 17, height 5\' 6"  (1.676 m), weight 73.5 kg, last menstrual period 10/19/2008, SpO2 95%.   Medical Problem List and Plan: 1. 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  Antithrombotics: -DVT/anticoagulation:  Pharmaceutical: Lovenox             -antiplatelet therapy: Aspirin 81 mg   3. Pain Management: Tylenol, Robaxin, oxycodone as needed   4. Mood/Behavior/Sleep: LCSW to evaluate and provide emotional support             -continue Paxil              -antipsychotic agents: n/a   5. Neuropsych/cognition: This patient is capable of making decisions on her own behalf.   6. Skin/Wound Care: Routine skin care checks             -skin rash on back>>Sarna   7. Fluids/Electrolytes/Nutrition: Routine Is and Os and follow-up chemistries             -continue B12 supplementation   8: Hypertension: monitor TID and prn             -continue amlodipine 10 mg daily             -continue losartan 50 mg daily   10: Hypokalemia: received 40 mEq today -recheck BMP in am   11: Elevated BUN: encourage oral hydration and follow-up BMP in am- was drinking Coke - switched to water                 Milinda Antis, PA-C 08/12/2023     I have personally performed a face to face diagnostic evaluation of this patient and formulated the key components of the plan.  Additionally, I have personally reviewed laboratory data, imaging studies, as well as relevant notes and concur with the physician assistant's documentation above.   The patient's status has not changed from the original H&P.  Any changes in documentation from the acute care chart have been noted above.

## 2023-08-12 NOTE — Progress Notes (Signed)
PMR Admission Coordinator Pre-Admission Assessment   Patient: Jill Allison is an 62 y.o., female MRN: [id number] DOB: Jul 03, 1961 Height: 5\' 6"  (167.6 cm) Weight: 73.5 kg   Insurance Information HMO:     PPO:      PCP:      IPA:      80/20:      OTHER:  PRIMARY: U[employer] commercial       Policy#: [id number]       Subscriber: Pt.  CM Name:       Phone#: [phone]     Fax#: [phone] Pre-Cert#: [id number] auth for CIR from Deidra with updates to Princess at number listed above on 10/30.  Employer:  Benefits:  Phone #:      Name:  Dolores Hoose Date: 10/19/2022- 10/19/2023 Deductible: $500 ($0 met) OOP Max: $2,500 ($44.52 met) CIR: 80% coverage, 20% co-insurance SNF: 100% coverage, Limited to 90 Days Per Calendar Year combined with [facility]. Outpatient:  $25/visit co-pay/visit, Limited to 60 Visits Per Calendar Year combined Home Health: $25/visit co-pay/visit; Limited to 90 Visits Per Calendar Year. 1 visit equals up to 4 hours of skilled care services. DME: 80% coverage, 20% co-insurance; You can get 1 type of DME or orthotic (including repair/replacement) every 3 Years Providers: in network    SECONDARY:       Policy#:      Phone#:    Artist:       Phone#:    The Data processing manager" for patients in Inpatient Rehabilitation Facilities with attached "Privacy Act Statement-Health Care Records" was provided and verbally reviewed with: n/a    Emergency Contact Information Contact Information       Name Relation Home Work Mobile    [place] Spouse [phone]   [phone]         Other Contacts   None on File        Current Medical History  Patient Admitting Diagnosis: GBS                               History of Present Illness: Jill Allison is a 62 y.o. female with history of anxiety who presented to [facility]  3 weeks after COVID infection on 08/06/23 with ptosis, progressive numbness, ataxia, weakness  and was found to have areflexia and albuminocytologic dissociation. the ED vital signs are notable only for hypertension, otherwise normal.  EKG was unremarkable.  Vitamin B12 was 200.  CBC and CMP were unremarkable.  No imaging was performed.  A lumbar puncture was performed currently results show negative CSF PCR panel for meningitis encephalitis, normal glucose, borderline elevated protein.  Neurology was consulted and recommended transfer to [facility] for in person consultation, she was transferred and admitted for further evaluation 08/06/23. She underwent LP which demonstrated elevated protein consistent with albumin no cytologic dissociation. 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 She completed course 08/11/23. Pt. Was seen by PT/OT and they recommend CIR to assist return to PLOF.      Patient's medical record from [facility]  has been reviewed by the rehabilitation admission coordinator and physician.   Past Medical History  History reviewed. No pertinent past medical history.       Has the patient had major surgery during 100 days prior to admission? Yes  Family History   family history is not on file.   Current Medications  Current Medications    Current Facility-Administered Medications:    acetaminophen (TYLENOL) tablet 650 mg, 650 mg, Oral, Q6H PRN, 650 mg at 08/10/23 2202 **OR** acetaminophen (TYLENOL) suppository 650 mg, 650 mg, Rectal, Q6H PRN, Venora Maples, MD   amLODipine (NORVASC) tablet 10 mg, 10 mg, Oral, Daily, Adhikari, Amrit, MD, 10 mg at 08/12/23 7253   aspirin EC tablet 81 mg, 81 mg, Oral, Daily, Venora Maples, MD, 81 mg at 08/11/23 2024   cyanocobalamin (VITAMIN B12) tablet 1,000 mcg, 1,000 mcg, Oral, Daily, Venora Maples, MD, 1,000 mcg at 08/12/23 0928   enoxaparin (LOVENOX) injection 40 mg, 40 mg, Subcutaneous,  Q24H, Arma Heading, RPH, 40 mg at 08/11/23 1539   famotidine (PEPCID) tablet 20 mg, 20 mg, Oral, Daily, Venora Maples, MD, 20 mg at 08/11/23 2024   losartan (COZAAR) tablet 50 mg, 50 mg, Oral, Daily, Adhikari, Amrit, MD, 50 mg at 08/12/23 0928   ondansetron (ZOFRAN) tablet 4 mg, 4 mg, Oral, Q6H PRN **OR** ondansetron (ZOFRAN) injection 4 mg, 4 mg, Intravenous, Q6H PRN, Venora Maples, MD, 4 mg at 08/11/23 6644   oxyCODONE (Oxy IR/ROXICODONE) immediate release tablet 5 mg, 5 mg, Oral, Q4H PRN, Venora Maples, MD, 5 mg at 08/08/23 0347   PARoxetine (PAXIL) tablet 20 mg, 20 mg, Oral, Daily, Venora Maples, MD, 20 mg at 08/11/23 2024   polyethylene glycol (MIRALAX / GLYCOLAX) packet 17 g, 17 g, Oral, Daily PRN, Venora Maples, MD, 17 g at 08/11/23 2030   sodium chloride flush (NS) 0.9 % injection 3 mL, 3 mL, Intravenous, Q12H, Venora Maples, MD, 3 mL at 08/12/23 0930   traZODone (DESYREL) tablet 50 mg, 50 mg, Oral, QHS PRN, Venora Maples, MD, 50 mg at 08/11/23 2030     Patients Current Diet:  Diet Order                  Diet regular Room service appropriate? Yes; Fluid consistency: Thin  Diet effective now                         Precautions / Restrictions Precautions Precautions: Fall Precaution Comments: ataxia Restrictions Weight Bearing Restrictions: No    Has the patient had 2 or more falls or a fall with injury in the past year? Yes   Prior Activity Level Community (5-7x/wk): Pt. was active in the community   Prior Functional Level Self Care: Did the patient need help bathing, dressing, using the toilet or eating? Independent   Indoor Mobility: Did the patient need assistance with walking from room to room (with or without device)? Independent   Stairs: Did the patient need assistance with internal or external stairs (with or without device)? Independent   Functional Cognition: Did the patient need help planning regular tasks such as  shopping or remembering to take medications? Independent   Patient Information Are you of Hispanic, Latino/a,or Spanish origin?: A. No, not of Hispanic, Latino/a, or Spanish origin What is your race?: A. White Do you need or want an interpreter to communicate with a doctor or health care staff?: 0. No   Patient's Response To:  Health Literacy and Transportation Is the patient able to respond to health literacy and transportation needs?: Yes Health Literacy - How often do you need to have someone help you when you read instructions, pamphlets, or other  written material from your doctor or pharmacy?: Never In the past 12 months, has lack of transportation kept you from medical appointments or from getting medications?: No In the past 12 months, has lack of transportation kept you from meetings, work, or from getting things needed for daily living?: No   Home Assistive Devices / Equipment Home Equipment: Shower seat - built in   Prior Device Use: Indicate devices/aids used by the patient prior to current illness, exacerbation or injury? Walker   Current Functional Level Cognition   Overall Cognitive Status: Within Functional Limits for tasks assessed Orientation Level: Oriented X4 General Comments: pt highly motivated    Extremity Assessment (includes Sensation/Coordination)   Upper Extremity Assessment: Generalized weakness, RUE deficits/detail, LUE deficits/detail RUE Deficits / Details: ataxic and weakness RUE Sensation: decreased proprioception RUE Coordination: decreased fine motor, decreased gross motor LUE Deficits / Details: ataxic movements LUE Sensation: decreased proprioception LUE Coordination: decreased fine motor, decreased gross motor  Lower Extremity Assessment: RLE deficits/detail, LLE deficits/detail RLE Deficits / Details: AROM WFL, strength hip flexion 4/5, knee extension 4+/5, ankle DF 4+/5, numbness in feet/tingling/decreased proprioception; ataxic movements with  difficulty grading RLE Sensation: decreased proprioception, decreased light touch RLE Coordination: decreased gross motor, decreased fine motor LLE Deficits / Details: AROM WFL, strength hip flexion 4/5, knee extension 4+/5, ankle DF 4+/5, numbness in feet/tingling/decreased proprioception; ataxic movements with difficulty grading LLE Sensation: decreased light touch, decreased proprioception LLE Coordination: decreased gross motor, decreased fine motor     ADLs   Overall ADL's : Needs assistance/impaired Eating/Feeding: Minimal assistance, Sitting Eating/Feeding Details (indicate cue type and reason): Pt reported decrease in dropping items but does need some assist with BUE such as cutting Grooming: Contact guard assist, Wash/dry hands, Wash/dry face, Sitting, Bed level Upper Body Bathing: Minimal assistance, Sitting, Bed level Lower Body Bathing: Maximal assistance, Sitting/lateral leans Upper Body Dressing : Minimal assistance, Sitting, Bed level Lower Body Dressing: Maximal assistance, Sitting/lateral leans, Cueing for safety, Cueing for sequencing Toilet Transfer: Maximal assistance, +2 for physical assistance, +2 for safety/equipment, Squat-pivot Toileting- Clothing Manipulation and Hygiene: Moderate assistance, Cueing for safety, Cueing for sequencing, Bed level Toileting - Clothing Manipulation Details (indicate cue type and reason): rolling side to side General ADL Comments: Deffered for further sessions     Mobility   Overal bed mobility: Needs Assistance Bed Mobility: Supine to Sit Rolling: Supervision Sidelying to sit: Used rails, Mod assist Supine to sit: Contact guard, HOB elevated, Used rails Sit to supine: Min assist General bed mobility comments: CGA for safety, cues to scoot fully out to EOB     Transfers   Overall transfer level: Needs assistance Equipment used: Rolling walker (2 wheels) Transfers: Sit to/from Stand, Bed to chair/wheelchair/BSC Sit to Stand: Min  assist Bed to/from chair/wheelchair/BSC transfer type:: Step pivot Step pivot transfers: Mod assist Transfer via Lift Equipment: [employer] transfer comment: min A to power up to stand with RW support from EOB x2 and recliner, mod A to step pivot EOB to chair with x1 LOB needing heavy mod to correct     Ambulation / Gait / Stairs / Wheelchair Mobility   Ambulation/Gait Ambulation/Gait assistance: Mod assist Gait Distance (Feet): 3 Feet Assistive device: Rolling walker (2 wheels) Gait Pattern/deviations: Step-to pattern, Ataxic General Gait Details: pt able to take a few steps forward/back from chair     Posture / Balance Dynamic Sitting Balance Sitting balance - Comments: Pt was able to remain at midline but required BUE support Balance Overall balance  assessment: Needs assistance Sitting-balance support: Feet supported, Bilateral upper extremity supported Sitting balance-Leahy Scale: Fair Sitting balance - Comments: Pt was able to remain at midline but required BUE support Standing balance support: Bilateral upper extremity supported Standing balance-Leahy Scale: Poor Standing balance comment: reliant on UE support and PRN min A to maintain static standing, mod A with dynamic tasks     Special needs/care consideration Skin n/a and Special service needs n/a    Previous Home Environment (from acute therapy documentation) Living Arrangements: Spouse/significant other  Lives With: Spouse Available Help at Discharge: Family Type of Home: House Home Layout: One level Home Access: Ramped entrance Bathroom Shower/Tub: Health visitor: Standard Bathroom Accessibility: Yes How Accessible: Accessible via walker Home Care Services: No Additional Comments: recently retired to spend time with her parents who are elderly; 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 (?)   Discharge Living Setting Plans for Discharge Living Setting: Patient's home Type of Home at  Discharge: House Discharge Home Layout: One level Discharge Home Access: Ramped entrance Discharge Bathroom Shower/Tub: Walk-in shower Discharge Bathroom Toilet: Standard Discharge Bathroom Accessibility: Yes How Accessible: Accessible via walker, Accessible via wheelchair Does the patient have any problems obtaining your medications?: No   Social/Family/Support Systems Patient Roles: Spouse Contact Information: [phone] Anticipated Caregiver: Pamler Welle Ability/Limitations of Caregiver: Min A-Mod A Caregiver Availability: 24/7 Discharge Plan Discussed with Primary Caregiver: Yes Is Caregiver In Agreement with Plan?: No Does Caregiver/Family have Issues with Lodging/Transportation while Pt is in Rehab?: No   Goals Patient/Family Goal for Rehab: PT/OT Min A Expected length of stay: 18-21 days Pt/Family Agrees to Admission and willing to participate: Yes Program Orientation Provided & Reviewed with Pt/Caregiver Including Roles  & Responsibilities: Yes   Decrease burden of Care through IP rehab admission: not anticipated   Possible need for SNF placement upon discharge: not anticipated    Patient Condition: I have reviewed medical records from [facility], spoken with CM, and patient. I met with patient at the bedside for inpatient rehabilitation assessment.  Patient will benefit from ongoing PT and OT, can actively participate in 3 hours of therapy a day 5 days of the week, and can make measurable gains during the admission.  Patient will also benefit from the coordinated team approach during an Inpatient Acute Rehabilitation admission.  The patient will receive intensive therapy as well as Rehabilitation physician, nursing, social worker, and care management interventions.  Due to safety, skin/wound care, disease management, medication administration, pain management, and patient education the patient requires 24 hour a day rehabilitation nursing.  The patient is  currently max assist with mobility and basic ADLs.  Discharge setting and therapy post discharge at home with home health is anticipated.  Patient has agreed to participate in the Acute Inpatient Rehabilitation Program and will admit today.   Preadmission Screen Completed By:  Stephania Fragmin, 08/12/2023 3:08 PM ______________________________________________________________________   Discussed status with Dr. Berline Chough on 08/12/23  at 3:08 PM  and received approval for admission today.   Admission Coordinator:  Stephania Fragmin, PT, time 3:08 PM Dorna Bloom 08/12/23     Assessment/Plan: Diagnosis: GBS- AIDP Does the need for close, 24 hr/day Medical supervision in concert with the patient's rehab needs make it unreasonable for this patient to be served in a less intensive setting? Yes Co-Morbidities requiring supervision/potential complications: Hyacinth Meeker Fischer Variant GBS; generalized weakness- sensory changes Due to bladder management, bowel management, safety, skin/wound care, disease management, medication administration, pain management,  and patient education, does the patient require 24 hr/day rehab nursing? Yes Does the patient require coordinated care of a physician, rehab nurse, PT, OT, and SLP to address physical and functional deficits in the context of the above medical diagnosis(es)? Yes Addressing deficits in the following areas: balance, endurance, locomotion, strength, transferring, bowel/bladder control, bathing, dressing, feeding, grooming, and toileting Can the patient actively participate in an intensive therapy program of at least 3 hrs of therapy 5 days a week? Yes The potential for patient to make measurable gains while on inpatient rehab is good Anticipated functional outcomes upon discharge from inpatient rehab: modified independent and supervision PT, modified independent and supervision OT, n/a SLP Estimated rehab length of stay to reach the above functional goals is: 16-18  days Anticipated discharge destination: Home 10. Overall Rehab/Functional Prognosis: good     MD Signature:

## 2023-08-13 DIAGNOSIS — G610 Guillain-Barre syndrome: Secondary | ICD-10-CM | POA: Diagnosis not present

## 2023-08-13 LAB — COMPREHENSIVE METABOLIC PANEL
ALT: 19 U/L (ref 0–44)
AST: 26 U/L (ref 15–41)
Albumin: 2.9 g/dL — ABNORMAL LOW (ref 3.5–5.0)
Alkaline Phosphatase: 62 U/L (ref 38–126)
Anion gap: 10 (ref 5–15)
BUN: 30 mg/dL — ABNORMAL HIGH (ref 8–23)
CO2: 27 mmol/L (ref 22–32)
Calcium: 9.1 mg/dL (ref 8.9–10.3)
Chloride: 101 mmol/L (ref 98–111)
Creatinine, Ser: 0.74 mg/dL (ref 0.44–1.00)
GFR, Estimated: >60 mL/min (ref >60)
Glucose, Bld: 101 mg/dL — ABNORMAL HIGH (ref 70–99)
Potassium: 4.4 mmol/L (ref 3.5–5.1)
Sodium: 138 mmol/L (ref 135–145)
Total Bilirubin: 0.5 mg/dL (ref 0.3–1.2)
Total Protein: 8.3 g/dL — ABNORMAL HIGH (ref 6.5–8.1)

## 2023-08-13 LAB — CBC WITH DIFFERENTIAL/PLATELET
Abs Immature Granulocytes: 0.01 10*3/uL (ref 0.00–0.07)
Basophils Absolute: 0 10*3/uL (ref 0.0–0.1)
Basophils Relative: 2 %
Eosinophils Absolute: 0 10*3/uL (ref 0.0–0.5)
Eosinophils Relative: 1 %
HCT: 35.9 % — ABNORMAL LOW (ref 36.0–46.0)
Hemoglobin: 11 g/dL — ABNORMAL LOW (ref 12.0–15.0)
Immature Granulocytes: 0 %
Lymphocytes Relative: 26 %
Lymphs Abs: 0.7 10*3/uL (ref 0.7–4.0)
MCH: 25.3 pg — ABNORMAL LOW (ref 26.0–34.0)
MCHC: 30.6 g/dL (ref 30.0–36.0)
MCV: 82.7 fL (ref 80.0–100.0)
Monocytes Absolute: 0.5 10*3/uL (ref 0.1–1.0)
Monocytes Relative: 19 %
Neutro Abs: 1.3 10*3/uL — ABNORMAL LOW (ref 1.7–7.7)
Neutrophils Relative %: 52 %
Platelets: 206 10*3/uL (ref 150–400)
RBC: 4.34 MIL/uL (ref 3.87–5.11)
RDW: 15.2 % (ref 11.5–15.5)
WBC: 2.6 10*3/uL — ABNORMAL LOW (ref 4.0–10.5)
nRBC: 0 % (ref 0.0–0.2)

## 2023-08-13 NOTE — Plan of Care (Signed)
  Problem: RH Balance Goal: LTG Patient will maintain dynamic standing with ADLs (OT) Description: LTG:  Patient will maintain dynamic standing balance with assist during activities of daily living (OT)  Flowsheets (Taken 08/13/2023 1201) LTG: Pt will maintain dynamic standing balance during ADLs with: Supervision/Verbal cueing   Problem: RH Eating Goal: LTG Patient will perform eating w/assist, cues/equip (OT) Description: LTG: Patient will perform eating with assist, with/without cues using equipment (OT) Flowsheets (Taken 08/13/2023 1201) LTG: Pt will perform eating with assistance level of: Independent   Problem: RH Grooming Goal: LTG Patient will perform grooming w/assist,cues/equip (OT) Description: LTG: Patient will perform grooming with assist, with/without cues using equipment (OT) Flowsheets (Taken 08/13/2023 1201) LTG: Pt will perform grooming with assistance level of: Independent   Problem: RH Bathing Goal: LTG Patient will bathe all body parts with assist levels (OT) Description: LTG: Patient will bathe all body parts with assist levels (OT) Flowsheets (Taken 08/13/2023 1201) LTG: Pt will perform bathing with assistance level/cueing: Independent with assistive device  LTG: Position pt will perform bathing: Shower   Problem: RH Dressing Goal: LTG Patient will perform upper body dressing (OT) Description: LTG Patient will perform upper body dressing with assist, with/without cues (OT). Flowsheets (Taken 08/13/2023 1201) LTG: Pt will perform upper body dressing with assistance level of: Independent Goal: LTG Patient will perform lower body dressing w/assist (OT) Description: LTG: Patient will perform lower body dressing with assist, with/without cues in positioning using equipment (OT) Flowsheets (Taken 08/13/2023 1201) LTG: Pt will perform lower body dressing with assistance level of: Independent with assistive device   Problem: RH Toileting Goal: LTG Patient will  perform toileting task (3/3 steps) with assistance level (OT) Description: LTG: Patient will perform toileting task (3/3 steps) with assistance level (OT)  Flowsheets (Taken 08/13/2023 1201) LTG: Pt will perform toileting task (3/3 steps) with assistance level: Independent with assistive device   Problem: RH Functional Use of Upper Extremity Goal: LTG Patient will use RT/LT upper extremity as a (OT) Description: LTG: Patient will use right/left upper extremity as a stabilizer/gross assist/diminished/nondominant/dominant level with assist, with/without cues during functional activity (OT) Flowsheets (Taken 08/13/2023 1201) LTG: Use of upper extremity in functional activities:  RUE as dominant level  LUE as nondominant level LTG: Pt will use upper extremity in functional activity with assistance level of: Independent   Problem: RH Simple Meal Prep Goal: LTG Patient will perform simple meal prep w/assist (OT) Description: LTG: Patient will perform simple meal prep with assistance, with/without cues (OT). Flowsheets (Taken 08/13/2023 1201) LTG: Pt will perform simple meal prep with assistance level of: Supervision/Verbal cueing LTG: Pt will perform simple meal prep w/level of: Ambulate with device   Problem: RH Toilet Transfers Goal: LTG Patient will perform toilet transfers w/assist (OT) Description: LTG: Patient will perform toilet transfers with assist, with/without cues using equipment (OT) Flowsheets (Taken 08/13/2023 1201) LTG: Pt will perform toilet transfers with assistance level of: Independent with assistive device   Problem: RH Tub/Shower Transfers Goal: LTG Patient will perform tub/shower transfers w/assist (OT) Description: LTG: Patient will perform tub/shower transfers with assist, with/without cues using equipment (OT) Flowsheets (Taken 08/13/2023 1201) LTG: Pt will perform tub/shower stall transfers with assistance level of: Supervision/Verbal cueing LTG: Pt will perform  tub/shower transfers from: Walk in shower

## 2023-08-13 NOTE — Progress Notes (Signed)
Inpatient Rehabilitation  Patient information reviewed and entered into eRehab system by Oyuki Hogan M. Eri Mcevers, M.A., CCC/SLP, PPS Coordinator.  Information including medical coding, functional ability and quality indicators will be reviewed and updated through discharge.    

## 2023-08-13 NOTE — Progress Notes (Signed)
Inpatient Rehabilitation Admission Medication Review by a Pharmacist  A complete drug regimen review was completed for this patient to identify any potential clinically significant medication issues.  High Risk Drug Classes Is patient taking? Indication by Medication  Antipsychotic No   Anticoagulant Yes Enoxparin-VTE px  Antibiotic No   Opioid Yes Oxycodone-pain  Antiplatelet Yes Aspirin-CAD  Hypoglycemics/insulin No   Vasoactive Medication Yes Losartan, amlodipine-HTN   Chemotherapy No   Other Yes Maalox, Famotidine -GERD Bisacodyl, Docusate, Miralax, fleets-constipation Methocarbamol-spasms Ondansetron-N/V Paxil-Mood Cyanocobalamine-Supplementation Guaifenesin-cough      Type of Medication Issue Identified Description of Issue Recommendation(s)  Drug Interaction(s) (clinically significant)     Duplicate Therapy     Allergy     No Medication Administration End Date     Incorrect Dose     Additional Drug Therapy Needed     Significant med changes from prior encounter (inform family/care partners about these prior to discharge).    Other       Clinically significant medication issues were identified that warrant physician communication and completion of prescribed/recommended actions by midnight of the next day:  No  Name of provider notified for urgent issues identified:   Provider Method of Notification:     Pharmacist comments:   Time spent performing this drug regimen review (minutes):  20  Aqsa Sensabaugh A. Jeanella Craze, PharmD, BCPS, FNKF Clinical Pharmacist [place] Please utilize Amion for appropriate phone number to reach the unit pharmacist [facility])  08/13/2023 7:44 AM

## 2023-08-13 NOTE — Progress Notes (Signed)
Inpatient Rehabilitation Care Coordinator Assessment and Plan Patient Details  Name: Jill Allison MRN: [id number] Date of Birth: 19-Dec-1960  Today's Date: 08/13/2023  Hospital Problems: Principal Problem:   GBS (Guillain-Barre syndrome) (HCC)  Past Medical History: History reviewed. No pertinent past medical history. Past Surgical History: History reviewed. No pertinent surgical history. Social History:  reports that she has never smoked. She does not have any smokeless tobacco history on file. She reports that she does not drink alcohol and does not use drugs.  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 Guardian/Conservator: none-according to MD pt is capable of making her own decisions while here   Abuse/Neglect Abuse/Neglect Assessment Can Be Completed: Yes Physical Abuse: Denies Verbal Abuse: Denies Sexual Abuse: Denies Exploitation of patient/patient's resources: Denies Self-Neglect: Denies  Patient response to: Social Isolation - How often do you feel lonely or isolated from those around you?: Rarely  Emotional Status Pt's affect, behavior and adjustment status: Pt is  motivated to be here and recover from her GBS she has always been independent and taken care of herself and is not one to ask for assist. She is very independent Recent Psychosocial Issues: healthy and never been in a hospital Psychiatric History: No history-issues. She seems to be coping appropriately and able to verbalize her feelings and concerns. Her husband is here to provide support Substance Abuse History: No issues  Patient / Family Perceptions, Expectations & Goals Pt/Family understanding of illness & functional limitations: Pt and husband can explain her condition and have spoken with the MD's involved with her care. Both feel they understand the plan moving forward and are glad to be here on the rehab unit Premorbid pt/family roles/activities: wife, retiree, daughter, friend, church member Anticipated changes in roles/activities/participation: resume Pt/family expectations/goals: Pt states: " I have never been in a hospital this has thrown me."  Husband states: " I know she will do what she can for herself she is very independent."  [employer]: None Premorbid Home Care/DME Agencies: None Transportation available at discharge: self anf husband Is the patient able to respond to transportation needs?: Yes In the past 12 months, has lack of transportation kept you from medical appointments or from getting medications?: No In the past 12 months, has lack of transportation kept you from meetings, work, or from getting things needed for daily living?: No  Discharge Planning Living Arrangements: Spouse/significant other Support Systems: Spouse/significant other, Parent, Other relatives, Friends/neighbors, Psychologist, clinical community Type of Residence: Private residence Insurance Resources: Media planner (specify) Education officer, museum) Financial Resources: Family Support, Social Security Financial Screen Referred: No Living Expenses: Own Money Management: Patient, Spouse Does  the patient have any problems obtaining your medications?: No Home Management: both Patient/Family Preliminary Plans: Return home with husband who plans to take a FMLA to be there with her and provide assist.  Aware being evaluted today and goals being set for stay here. She is ready to work in therapies Care Coordinator Anticipated Follow Up Needs: HH/OP  Clinical Impression Pleasant female who unfortunately has gotten GBS and now needs to recover from this. Her husband is involved and will take FMLA to provide assist to her. Will update once team conference on Tuesday  Lucy Chris 08/13/2023, 9:20 AM

## 2023-08-13 NOTE — Progress Notes (Signed)
PROGRESS NOTE   Subjective/Complaints:  Pt reports slept well Admits she doesn't like water but trying to drink water.   LBM yesterday Some low back pain- took tylenol- worked.   Peeing well- stopped Purewick   ROS:  Pt denies SOB, abd pain, CP, N/V/C/D, and vision changes   Objective:   No results found. Recent Labs    08/12/23 1945 08/13/23 0614  WBC 3.5* 2.6*  HGB 11.1* 11.0*  HCT 35.9* 35.9*  PLT 243 206   Recent Labs    08/11/23 0522 08/13/23 0614  NA 134* 138  K 3.3* 4.4  CL 101 101  CO2 27 27  GLUCOSE 97 101*  BUN 34* 30*  CREATININE 0.67 0.74  CALCIUM 8.8* 9.1    Intake/Output Summary (Last 24 hours) at 08/13/2023 1913 Last data filed at 08/13/2023 1831 Gross per 24 hour  Intake 720 ml  Output --  Net 720 ml        Physical Exam: Vital Signs Blood pressure 115/66, pulse 88, temperature 97.8 F (36.6 C), temperature source Oral, resp. rate 19, height 5\' 6"  (1.676 m), weight 71.2 kg, last menstrual period 10/19/2008, SpO2 98%.    General: awake, alert, appropriate, NAD HENT: conjugate gaze; oropharynx moist CV: regular rate; no JVD Pulmonary: CTA B/L; no W/R/R- good air movement GI: soft, NT, ND, (+)BS Psychiatric: appropriate Neurological: Ox3 Musculoskeletal:     Cervical back: Neck supple. No tenderness.     Comments: Deltoids 4/5; Biceps 4+/5; Triceps 4/5; WE 5-/5; Grip 5-/5 and FA 4+/5 HF 4 to 4+/5; KE 4/5 KF 4+/5; DF 4+/5 and PF 5-/5 all B/L  Skin:    General: Skin is warm and dry.     Comments: Scattered erythematous patches on back Has R wrist iv- looks OK  Neurological:     Mental Status: She is alert and oriented to person, place, and time.     Comments: Intact to light touch in all 4 extremities Ox3 Absent DTR  Assessment/Plan: 1. Functional deficits which require 3+ hours per day of interdisciplinary therapy in a comprehensive inpatient rehab  setting. Physiatrist is providing close team supervision and 24 hour management of active medical problems listed below. Physiatrist and rehab team continue to assess barriers to discharge/monitor patient progress toward functional and medical goals  Care Tool:  Bathing    Body parts bathed by patient: Right arm, Left arm, Chest, Abdomen, Right upper leg, Buttocks, Front perineal area, Left upper leg, Right lower leg, Left lower leg, Face         Bathing assist Assist Level: Minimal Assistance - Patient > 75%     Upper Body Dressing/Undressing Upper body dressing   What is the patient wearing?: Pull over shirt    Upper body assist Assist Level: Minimal Assistance - Patient > 75%    Lower Body Dressing/Undressing Lower body dressing      What is the patient wearing?: Underwear/pull up, Pants     Lower body assist Assist for lower body dressing: Minimal Assistance - Patient > 75%     Toileting Toileting    Toileting assist Assist for toileting: Minimal Assistance - Patient > 75%  Transfers Chair/bed transfer  Transfers assist     Chair/bed transfer assist level: Minimal Assistance - Patient > 75%     Locomotion Ambulation   Ambulation assist      Assist level: Minimal Assistance - Patient > 75% Assistive device: Walker-rolling Max distance: 15 ft   Walk 10 feet activity   Assist     Assist level: Minimal Assistance - Patient > 75% Assistive device: Walker-rolling   Walk 50 feet activity   Assist Walk 50 feet with 2 turns activity did not occur: Safety/medical concerns         Walk 150 feet activity   Assist Walk 150 feet activity did not occur: Safety/medical concerns         Walk 10 feet on uneven surface  activity   Assist Walk 10 feet on uneven surfaces activity did not occur: Safety/medical concerns         Wheelchair     Assist Is the patient using a wheelchair?: Yes Type of Wheelchair: Manual    Wheelchair  assist level: Supervision/Verbal cueing Max wheelchair distance: 150 ft    Wheelchair 50 feet with 2 turns activity    Assist            Wheelchair 150 feet activity     Assist      Assist Level: Supervision/Verbal cueing   Blood pressure 115/66, pulse 88, temperature 97.8 F (36.6 C), temperature source Oral, resp. rate 19, height 5\' 6"  (1.676 m), weight 71.2 kg, last menstrual period 10/19/2008, SpO2 98%.   Medical Problem List and Plan: 1. 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  Antithrombotics: -DVT/anticoagulation:  Pharmaceutical: Lovenox             -antiplatelet therapy: Aspirin 81 mg   3. Pain Management: Tylenol, Robaxin, oxycodone as needed   4. Mood/Behavior/Sleep: LCSW to evaluate and provide emotional support             -continue Paxil              -antipsychotic agents: n/a   5. Neuropsych/cognition: This patient is capable of making decisions on her own behalf.   6. Skin/Wound Care: Routine skin care checks             -skin rash on back>>Sarna   7. Fluids/Electrolytes/Nutrition: Routine Is and Os and follow-up chemistries             -continue B12 supplementation   8: Hypertension: monitor TID and prn             -continue amlodipine 10 mg daily             -continue losartan 50 mg daily   10/25- BP controlled- con't regimen 10: Hypokalemia: received 40 mEq today -recheck BMP in am   10/25- K_ up to 4.4 con't to monitor 11: Elevated BUN: encourage oral hydration and follow-up BMP in am- was drinking Coke - switched to water   10/25- BMP shows mild improvement in BUN- will con't to push fluids          LOS: 1 days A FACE TO FACE EVALUATION WAS PERFORMED  Cleveland Yarbro 08/13/2023, 7:13 PM

## 2023-08-13 NOTE — Progress Notes (Signed)
Met with patient with family at bedside.  Discuss Christianne Dolin syndrome and provided information.  Reports that vision has improved as well as the weakness/tingling. Discussed alternative pain/neuropathy control and educated if using heat/ice to only allow or less.  Constipation improving. HTN new.  Suggested to monitor blood pressure at home and provide readings to PCP when going to her follow up appointment. Provided record sheet to keep track of BP at home.  Provided handouts on prediabetes A1C 6.0 and ways to reduce/lower A1c. Also provided information on voting but didn't help with getting vote in place.  Asked Loel Lofty RN if would try to assist with this.  All needs met, call bell in reach.

## 2023-08-13 NOTE — Evaluation (Signed)
Occupational Therapy Assessment and Plan  Patient Details  Name: Jill Allison MRN: [id number] Date of Birth: 06-05-1961  OT Diagnosis: ataxia and muscle weakness (generalized) Rehab Potential: Rehab Potential (ACUTE ONLY): Good ELOS: 2 weeks   Today's Date: 08/13/2023 OT Individual Time: 4098-1191 OT Individual Time Calculation (min): 58 min     Hospital Problem: Principal Problem:   GBS (Guillain-Barre syndrome) (HCC)   Past Medical History: History reviewed. No pertinent past medical history. Past Surgical History: History reviewed. No pertinent surgical history.  Assessment & Plan Clinical Impression: Patient is a 62 yo female who presented to department on 08/06/2023 complaining of increasing pain and weakness in the extremities bilaterally.  She reported a history of COVID infection approximately 2 weeks prior to admission.  On presentation, she described pain in both feet, numbness and tingling in both hands.  Vitamin B12 level 200.  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  She was started on IVIG and completed 5-day course.  Patient transferred to CIR on 08/12/2023 .    Patient currently requires min with basic self-care skills secondary to muscle weakness.  Prior to hospitalization, patient could complete IADL's with no assistance.  Patient will benefit from skilled intervention to increase independence with basic self-care skills and increase level of independence with iADL prior to discharge home with care partner.  Anticipate patient will require intermittent supervision and follow up home health and follow up outpatient.  OT - End of Session Activity Tolerance: Improving Endurance Deficit: Yes OT Assessment Rehab Potential (ACUTE ONLY): Good OT Patient demonstrates impairments in the following area(s): Endurance;Balance;Motor OT Basic ADL's Functional Problem(s):  Eating;Grooming;Bathing;Dressing;Toileting OT Advanced ADL's Functional Problem(s): Simple Meal Preparation OT Transfers Functional Problem(s): Toilet;Tub/Shower OT Additional Impairment(s): Fuctional Use of Upper Extremity OT Plan OT Intensity: Minimum of 1-2 x/day, 45 to 90 minutes OT Frequency: 5 out of 7 days OT Duration/Estimated Length of Stay: 2 weeks OT Treatment/Interventions: Balance/vestibular training;Discharge planning;Self Care/advanced ADL retraining;Pain management;Therapeutic Activities;UE/LE Coordination activities;Therapeutic Exercise;Patient/family education;Functional mobility training;Disease mangement/prevention;Community reintegration;DME/adaptive equipment instruction;Neuromuscular re-education;UE/LE Strength taining/ROM OT Self Feeding Anticipated Outcome(s): Independent OT Basic Self-Care Anticipated Outcome(s): Mod I OT Toileting Anticipated Outcome(s): Mod I OT Bathroom Transfers Anticipated Outcome(s): shower- SPV OT Recommendation Recommendations for Other Services: Therapeutic Recreation consult Therapeutic Recreation Interventions: Kitchen group;Outing/community reintergration Patient destination: Home Follow Up Recommendations: Home health OT;Outpatient OT Equipment Recommended: 3 in 1 bedside comode;Rolling walker with 5" wheels   OT Evaluation Precautions/Restrictions  Precautions Precautions: Fall Restrictions Weight Bearing Restrictions: No General Chart Reviewed: Yes Family/Caregiver Present: Yes Vital Signs   Pain Pain Assessment Pain Scale: 0-10 Pain Score: 4  Pain Type: Acute pain Pain Location: Back Pain Descriptors / Indicators: Aching Pain Onset: Gradual Patients Stated Pain Goal: 0 Pain Intervention(s): Repositioned Multiple Pain Sites: No Home Living/Prior Functioning Home Living Family/patient expects to be discharged to:: Private residence Living Arrangements: Spouse/significant other Available Help at Discharge:  Family Type of Home: House Home Access: Stairs to enter, Ship broker of Steps: Steps at front entrance, will use ramp at side entrance Home Layout: One level Bathroom Shower/Tub: Health visitor: Handicapped height Bathroom Accessibility: Yes Additional Comments: recently retired to spend time with her parents who are elderly; 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  Lives With: Spouse Prior Function Level of Independence: Independent with basic ADLs, Independent with gait  Able to Take Stairs?: Yes Driving: Yes Vocation: Part time employment Vocation Requirements: Retired from job at [employer]  agency, 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) Baseline Vision/History: 1 Wears glasses Ability to See in Adequate Light: 0 Adequate Patient Visual Report: No change from baseline Vision Assessment?: Wears glasses for reading Perception    Praxis   Cognition Cognition Overall Cognitive Status: Within Functional Limits for tasks assessed Arousal/Alertness: Awake/alert Orientation Level: Person;Place;Situation Person: Oriented Place: Oriented Situation: Oriented Memory: Appears intact Attention: Focused Focused Attention: Appears intact Awareness: Appears intact Problem Solving: Appears intact Safety/Judgment: Appears intact Brief Interview for Mental Status (BIMS) Repetition of Three Words (First Attempt): 3 Temporal Orientation: Year: Correct Temporal Orientation: Month: Accurate within 5 days Temporal Orientation: Day: Correct Recall: "Sock": Yes, no cue required Recall: "Blue": Yes, no cue required Recall: "Bed": Yes, no cue required BIMS Summary Score: 15 Sensation Sensation Light Touch: Impaired by gross assessment Hot/Cold: Appears Intact Proprioception: Appears Intact Stereognosis: Appears Intact Additional Comments: mild pins and needles in fingers, reports numbness, but doesnt show deficits  during FM manipulation Coordination Gross Motor Movements are Fluid and Coordinated: No Fine Motor Movements are Fluid and Coordinated: No Coordination and Movement Description: mild tremor that increases with fatigue Motor  Motor Motor: Ataxia  Trunk/Postural Assessment     Balance Dynamic Sitting Balance Sitting balance - Comments: Pt was able to remain at midline but required BUE support Extremity/Trunk Assessment RUE Assessment RUE Assessment: Exceptions to [facility] RUE Strength RUE Overall Strength: Deficits Right Shoulder Flexion: 4-/5 Right Shoulder Extension: 4/5 Right Shoulder ABduction: 4-/5 Right Shoulder Internal Rotation: 4/5 Right Shoulder External Rotation: 4-/5 Right Shoulder Horizontal ABduction: 4-/5 Right Shoulder Horizontal ADduction: 4-/5 Right Elbow Flexion: 4/5 Right Elbow Extension: 4-/5 Right Forearm Pronation: 4-/5 Right Forearm Supination: 4-/5 Right Wrist Flexion: 4/5 Right Wrist Extension: 4/5 Right Wrist Radial Deviation: 4-/5 Right Wrist Ulnar Deviation: 4-/5 Right Hand Grip (lbs): 40lb Right Hand Lateral Pinch: 12 lbs Right Hand 3 Point Pinch: 8 lbs LUE Assessment LUE Assessment: Exceptions to [facility] LUE Strength LUE Overall Strength: Deficits Left Shoulder Flexion: 4-/5 Left Shoulder Extension: 4-/5 Left Shoulder ABduction: 4-/5 Left Shoulder Internal Rotation: 4/5 Left Shoulder External Rotation: 4-/5 Left Shoulder Horizontal ABduction: 4/5 Left Shoulder Horizontal ADduction: 4/5 Left Elbow Flexion: 4/5 Left Elbow Extension: 4-/5 Left Forearm Pronation: 4-/5 Left Forearm Supination: 4-/5 Left Wrist Flexion: 4-/5 Left Wrist Extension: 4-/5 Left Wrist Radial Deviation: 4-/5 Left Wrist Ulnar Deviation: 4-/5 Left Hand Gross Grasp: Impaired Left Hand Grip (lbs): 42 Left Hand Lateral Pinch: 8 lbs Left Hand 3 Point Pinch: 6 lbs  Care Tool Care Tool Self Care Eating   Eating Assist Level: Set up assist    Oral Care    Oral Care  Assist Level: Set up assist    Bathing   Body parts bathed by patient: Right arm;Left arm;Chest;Abdomen;Right upper leg;Buttocks;Front perineal area;Left upper leg;Right lower leg;Left lower leg;Face     Assist Level: Minimal Assistance - Patient > 75%    Upper Body Dressing(including orthotics)   What is the patient wearing?: Pull over shirt   Assist Level: Minimal Assistance - Patient > 75%    Lower Body Dressing (excluding footwear)   What is the patient wearing?: Underwear/pull up;Pants Assist for lower body dressing: Minimal Assistance - Patient > 75%    Putting on/Taking off footwear   What is the patient wearing?: Non-skid slipper socks Assist for footwear: Set up assist       Care Tool Toileting Toileting activity   Assist for toileting: Minimal Assistance - Patient > 75%  Care Tool Bed Mobility Roll left and right activity   Roll left and right assist level: Supervision/Verbal cueing    Sit to lying activity   Sit to lying assist level: Contact Guard/Touching assist    Lying to sitting on side of bed activity   Lying to sitting on side of bed assist level: the ability to move from lying on the back to sitting on the side of the bed with no back support.: Supervision/Verbal cueing     Care Tool Transfers Sit to stand transfer   Sit to stand assist level: Minimal Assistance - Patient > 75%    Chair/bed transfer   Chair/bed transfer assist level: Minimal Assistance - Patient > 75%     Toilet transfer   Assist Level: Minimal Assistance - Patient > 75%     Care Tool Cognition  Expression of Ideas and Wants Expression of Ideas and Wants: 4. Without difficulty (complex and basic) - expresses complex messages without difficulty and with speech that is clear and easy to understand  Understanding Verbal and Non-Verbal Content Understanding Verbal and Non-Verbal Content: 4. Understands (complex and basic) - clear comprehension without cues or repetitions    Memory/Recall Ability  Intact   Refer to Care Plan for Long Term Goals  SHORT TERM GOAL WEEK 1 OT Short Term Goal 1 (Week 1): Patient to perform toileting with SBA OT Short Term Goal 2 (Week 1): Patient to perform LE dressing with set up assist OT Short Term Goal 3 (Week 1): Patient to perform grooming standing at the sink with close SBA OT Short Term Goal 4 (Week 1): Patient to perform bathing with close SBA  Recommendations for other services: Adult nurse group and Outing/community reintegration   Skilled Therapeutic Intervention ADL ADL Eating: Set up Where Assessed-Eating: Bed level Grooming: Setup Where Assessed-Grooming: Chair;Sitting at sink Upper Body Bathing: Setup Where Assessed-Upper Body Bathing: Shower;Chair Lower Body Bathing: Minimal assistance Where Assessed-Lower Body Bathing: Chair;Shower Upper Body Dressing: Minimal assistance Where Assessed-Upper Body Dressing: Wheelchair Lower Body Dressing: Minimal assistance Where Assessed-Lower Body Dressing: Wheelchair Toileting: Minimal assistance Where Assessed-Toileting: Teacher, adult education: Curator Method: Surveyor, minerals: Engineer, technical sales: Not assessed Tub/Shower Transfer Method: Engineer, technical sales: Transfer tub bench;Grab bars;Walk in shower Walk-In Shower Transfer: Minimal assistance Film/video editor Method: Warden/ranger: Transfer tub bench;Grab bars Mobility  Bed Mobility Bed Mobility: Supine to Sit Supine to Sit: Supervision/Verbal cueing Transfers Sit to Stand: Minimal Assistance - Patient > 75%   Discharge Criteria: Patient will be discharged from OT if patient refuses treatment 3 consecutive times without medical reason, if treatment goals not met, if there is a change in medical status, if patient makes no progress towards goals or if patient is discharged from  hospital.  The above assessment, treatment plan, treatment alternatives and goals were discussed and mutually agreed upon: by patient and by family  Warnell Forester 08/13/2023, 12:25 PM

## 2023-08-13 NOTE — IPOC Note (Signed)
Overall Plan of Care [facility]) Patient Details Name: Jill Allison MRN: [id number] DOB: 08-Jul-1961  Admitting Diagnosis: GBS (Guillain-Barre syndrome) [facility])  Hospital Problems: Principal Problem:   GBS (Guillain-Barre syndrome) (HCC)     Functional Problem List: Nursing Bowel, Endurance, Medication Management, Motor, Pain, Safety, Sensory  PT Balance, Pain, Safety, Perception, Sensory, Motor, Skin Integrity, Endurance  OT Endurance, Balance, Motor  SLP    TR         Basic ADL's: OT Eating, Grooming, Bathing, Dressing, Toileting     Advanced  ADL's: OT Simple Meal Preparation     Transfers: PT Bed Mobility, Bed to Chair, Car, Occupational psychologist, Research scientist (life sciences): PT Ambulation, Psychologist, prison and probation services, Stairs     Additional Impairments: OT Fuctional Use of Upper Extremity  SLP        TR      Anticipated Outcomes Item Anticipated Outcome  Self Feeding Independent  Swallowing      Basic self-care  Mod I  Toileting  Mod I   Bathroom Transfers shower- SPV  Bowel/Bladder  continent of bowel/bladder  Transfers  mod I with LRAD  Locomotion  supervision with LRAD  Communication     Cognition     Pain  less than 4  Safety/Judgment  no falls   Therapy Plan: PT Intensity: Minimum of 1-2 x/day ,45 to 90 minutes PT Frequency: 5 out of 7 days PT Duration Estimated Length of Stay: 16-18 days OT Intensity: Minimum of 1-2 x/day, 45 to 90 minutes OT Frequency: 5 out of 7 days OT Duration/Estimated Length of Stay: 2 weeks     Team Interventions: Nursing Interventions Patient/Family Education, Bowel Management, Pain Management, Medication Management, Discharge Planning, Psychosocial Support  PT interventions Ambulation/gait training, Discharge planning, DME/adaptive equipment instruction, Functional mobility training, Pain management, Psychosocial support, Therapeutic Activities, UE/LE Strength taining/ROM, Visual/perceptual remediation/compensation,  Wheelchair propulsion/positioning, UE/LE Coordination activities, Therapeutic Exercise, Stair training, Patient/family education, Neuromuscular re-education, Functional electrical stimulation, Disease management/prevention, Firefighter, Warden/ranger  OT Interventions Warden/ranger, Discharge planning, Self Care/advanced ADL retraining, Pain management, Therapeutic Activities, UE/LE Coordination activities, Therapeutic Exercise, Patient/family education, Functional mobility training, Disease mangement/prevention, Firefighter, Fish farm manager, Neuromuscular re-education, UE/LE Strength taining/ROM  SLP Interventions    TR Interventions    SW/CM Interventions Discharge Planning, Psychosocial Support, Patient/Family Education   Barriers to Discharge MD  Medical stability, Home enviroment access/loayout, and Lack of/limited family support  Nursing Decreased caregiver support, Weight bearing restrictions lives in 1 level with ramp entry  PT Home environment access/layout    OT      SLP      SW       Team Discharge Planning: Destination: PT-Home ,OT- Home , SLP-  Projected Follow-up: PT-Outpatient PT, OT-  Home health OT, Outpatient OT, SLP-  Projected Equipment Needs: PT-To be determined, OT- 3 in 1 bedside comode, Rolling walker with 5" wheels, SLP-  Equipment Details: PT- , OT-  Patient/family involved in discharge planning: PT- Patient,  OT-Patient, Family member/caregiver, SLP-   MD ELOS: 2 weeks Medical Rehab Prognosis:  Good Assessment: The patient has been admitted for CIR therapies with the diagnosis of GBS. The team will be addressing functional mobility, strength, stamina, balance, safety, adaptive techniques and equipment, self-care, bowel and bladder mgt, patient and caregiver education, . Goals have been set at mod I to supervision. Anticipated discharge destination is home.        See Team Conference  Notes for weekly updates to the  plan of care

## 2023-08-13 NOTE — Progress Notes (Signed)
Inpatient Rehabilitation Center Individual Statement of Services  Patient Name:  Jill Allison  Date:  08/13/2023  Welcome to the Inpatient Rehabilitation Center.  Our goal is to provide you with an individualized program based on your diagnosis and situation, designed to meet your specific needs.  With this comprehensive rehabilitation program, you will be expected to participate in at least 3 hours of rehabilitation therapies Monday-Friday, with modified therapy programming on the weekends.  Your rehabilitation program will include the following services:  Physical Therapy (PT), Occupational Therapy (OT), 24 hour per day rehabilitation nursing, Therapeutic Recreaction (TR), Care Coordinator, Rehabilitation Medicine, Nutrition Services, and Pharmacy Services  Weekly team conferences will be held on Tuesday to discuss your progress.  Your Inpatient Rehabilitation Care Coordinator will talk with you frequently to get your input and to update you on team discussions.  Team conferences with you and your family in attendance may also be held.  Expected length of stay: 2 weeks  Overall anticipated outcome:   Depending on your progress and recovery, your program may change. Your Inpatient Rehabilitation Care Coordinator will coordinate services and will keep you informed of any changes. Your Inpatient Rehabilitation Care Coordinator's name and contact numbers are listed  below.  The following services may also be recommended but are not provided by the Inpatient Rehabilitation Center:  Driving Evaluations Home Health Rehabiltiation Services Outpatient Rehabilitation Services   Arrangements will be made to provide these services after discharge if needed.  Arrangements include referral to agencies that provide these services.  Your insurance has been verified to be:  [facility] Your primary doctor is:  Mila Palmer  Pertinent information will be shared with your doctor and your insurance  company.  Inpatient Rehabilitation Care Coordinator:  Susie Cassette [phone] or ([phone]  Information discussed with and copy given to patient by: Lucy Chris, 08/13/2023, 9:22 AM

## 2023-08-13 NOTE — Progress Notes (Signed)
Occupational Therapy Session Note  Patient Details  Name: Jill Allison MRN: [id number] Date of Birth: Oct 03, 1961  Today's Date: 08/13/2023 OT Individual Time: 1045-1200 OT Individual Time Calculation (min): 75 min    Short Term Goals: Week 1:  OT Short Term Goal 1 (Week 1): Patient to perform toileting with SBA OT Short Term Goal 2 (Week 1): Patient to perform LE dressing with set up assist OT Short Term Goal 3 (Week 1): Patient to perform grooming standing at the sink with close SBA OT Short Term Goal 4 (Week 1): Patient to perform bathing with close SBA  Skilled Therapeutic Interventions/Progress Updates:  Balance/vestibular training;Discharge planning;Self Care/advanced ADL retraining;Pain management;Therapeutic Activities;UE/LE Coordination activities;Therapeutic Exercise;Patient/family education;Functional mobility training;Disease mangement/prevention;Community reintegration;DME/adaptive equipment instruction;Neuromuscular re-education;UE/LE Strength taining/ROM   Therapy Documentation Precautions:  Precautions Precautions: Fall Restrictions Weight Bearing Restrictions: No General: General Chart Reviewed: Yes Family/Caregiver Present: Yes Vital Signs:   Pain: Pain Assessment Pain Scale: 0-10 Pain Score: 4  Pain Type: Acute pain Pain Location: Back Pain Descriptors / Indicators: Aching Pain Onset: Gradual Patients Stated Pain Goal: 0 Pain Intervention(s): Repositioned Multiple Pain Sites: No ADL: ADL Eating: Set up Where Assessed-Eating: Bed level Grooming: Setup Where Assessed-Grooming: Chair, Sitting at sink Upper Body Bathing: Setup Where Assessed-Upper Body Bathing: Air traffic controller, Chair Lower Body Bathing: Minimal assistance Where Assessed-Lower Body Bathing: Chair, Water quality scientist Dressing: Minimal assistance Where Assessed-Upper Body Dressing: Wheelchair Lower Body Dressing: Minimal assistance Where Assessed-Lower Body Dressing: Wheelchair Toileting:  Minimal assistance Where Assessed-Toileting: Teacher, adult education: Curator Method: Surveyor, minerals: Engineer, technical sales: Not assessed Tub/Shower Transfer Method: Engineer, technical sales: Emergency planning/management officer, Grab bars, Walk in Music therapist: Insurance underwriter Method: Warden/ranger: Emergency planning/management officer, Grab bars Vision Baseline Vision/History: 1 Wears glasses Patient Visual Report: No change from baseline Vision Assessment?: Wears glasses for reading Perception    Praxis   Balance Dynamic Sitting Balance Sitting balance - Comments: Pt was able to remain at midline but required BUE support Exercises: Patient seen for dynamic UE exercise tasks, FM manipulation and strength and dynamic standing balance. Patient received sitting up in w/c motivated to continue with treatment plan. Patient assisted in w/c to therapy gym working on FM coordination manipulating small pegs with tweezers to place into peg board. Able to perform with BUE's with mild tremor. Continued with FM tasks in standing working on dynamic balance and endurance to facilitate improved independence with transfers and self care. Patient needed Min assist with sit to stand and CGA provided while standing. Tolerated standing in 2-3 minute intervals. Continued with strength and endurance tasks utilizing 2 and 3 lb dowels for two sets of 10 each exercise in varied planes to improve overall BUE strength. Continue with skilled OT POC.    Other Treatments:     Therapy/Group: Individual Therapy  Warnell Forester 08/13/2023, 12:28 PM

## 2023-08-13 NOTE — Evaluation (Signed)
Physical Therapy Assessment and Plan  Patient Details  Name: Jill Allison MRN: [id number] Date of Birth: [birth year]  PT Diagnosis: Abnormality of gait, Ataxic gait, Difficulty walking, Impaired sensation, Low back pain, and Muscle weakness Rehab Potential: Excellent ELOS: 16-18 days   Today's Date: 08/13/2023 PT Individual Time: 1300-1400 PT Individual Time Calculation (min): 60 min    Hospital Problem: Principal Problem:   GBS (Guillain-Barre syndrome) (HCC)   Past Medical History: History reviewed. No pertinent past medical history. Past Surgical History: History reviewed. No pertinent surgical history.  Assessment & Plan Clinical Impression: Patient is a 62 yo female who presented to department on 08/06/2023 complaining of increasing pain and weakness in the extremities bilaterally.  She reported a history of COVID infection approximately 2 weeks prior to admission.  On presentation, she described pain in both feet, numbness and tingling in both hands.  Vitamin B12 level 200.  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  She was started on IVIG and completed 5-day course.   Patient transferred to CIR on 08/12/2023 .   Patient currently requires min with mobility secondary to muscle weakness, impaired timing and sequencing and ataxia, and decreased sitting balance, decreased standing balance, and decreased balance strategies.  Prior to hospitalization, patient was independent  with mobility and lived with Spouse in a House home.  Home access is Steps at front entrance, will use ramp at side entranceStairs to enter, Ramped entrance.  Patient will benefit from skilled PT intervention to maximize safe functional mobility, minimize fall risk, and decrease caregiver burden for planned discharge home with 24 hour supervision.  Anticipate patient will benefit from follow up OP at discharge.  PT - End of Session Activity  Tolerance: Tolerates 30+ min activity with multiple rests Endurance Deficit: Yes Endurance Deficit Description: poor muscular endurance PT Assessment Rehab Potential (ACUTE/IP ONLY): Excellent PT Barriers to Discharge: Home environment access/layout PT Patient demonstrates impairments in the following area(s): Balance;Pain;Safety;Perception;Sensory;Motor;Skin Integrity;Endurance PT Transfers Functional Problem(s): Bed Mobility;Bed to Chair;Car;Furniture PT Locomotion Functional Problem(s): Ambulation;Wheelchair Mobility;Stairs PT Plan PT Intensity: Minimum of 1-2 x/day ,45 to 90 minutes PT Frequency: 5 out of 7 days PT Duration Estimated Length of Stay: 16-18 days PT Treatment/Interventions: Ambulation/gait training;Discharge planning;DME/adaptive equipment instruction;Functional mobility training;Pain management;Psychosocial support;Therapeutic Activities;UE/LE Strength taining/ROM;Visual/perceptual remediation/compensation;Wheelchair propulsion/positioning;UE/LE Coordination activities;Therapeutic Exercise;Stair training;Patient/family education;Neuromuscular re-education;Functional electrical stimulation;Disease management/prevention;Community reintegration;Balance/vestibular training PT Transfers Anticipated Outcome(s): mod I with LRAD PT Locomotion Anticipated Outcome(s): supervision with LRAD PT Recommendation Recommendations for Other Services: Therapeutic Recreation consult Therapeutic Recreation Interventions: Warehouse manager;Outing/community reintergration;Pet therapy Follow Up Recommendations: Outpatient PT Patient destination: Home Equipment Recommended: To be determined   PT Evaluation Precautions/Restrictions Precautions Precautions: Fall Precaution Comments: ataxia Restrictions Weight Bearing Restrictions: No General   Vital Signs  Pain No pain at this time  Pain Interference Pain Effect on Sleep: 1. Rarely or not at all Pain Interference with  Therapy Activities: 1. Rarely or not at all Pain Interference with Day-to-Day Activities: 1. 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  Lives With: Spouse Prior Function Level of Independence: Independent with basic  ADLs;Independent with gait  Able to Take Stairs?: Yes Driving: Yes Vocation: Part time employment Vocation Requirements: Retired from job at  [employer], 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 Vision/Perception  Vision - History Ability to See in Adequate Light: 0 Adequate Perception Perception: Within Functional Limits Praxis Praxis: WFL  Cognition Overall Cognitive Status: Within Functional Limits for tasks assessed Arousal/Alertness: Awake/alert Orientation Level: Oriented X4 Month: October Attention: Focused Focused Attention: Appears intact Memory: Appears intact Awareness: Appears intact Problem Solving: Appears intact Safety/Judgment: Appears intact Sensation Sensation Light Touch: Impaired Detail Peripheral sensation comments: Reports ~70% sensation in all 4 extremities with N+t. Hot/Cold: Appears Intact Proprioception: Appears Intact Stereognosis: Appears Intact Additional Comments: mild pins and needles in fingers, reports numbness, but doesnt show deficits during FM manipulation Coordination Gross Motor Movements are Fluid and Coordinated: No Fine Motor Movements are Fluid and Coordinated: No Coordination and Movement Description: mild tremor that increases with  fatigue, ataxia Motor  Motor Motor: Ataxia;Motor impersistence   Trunk/Postural Assessment  Cervical Assessment Cervical Assessment: Within Functional Limits Thoracic Assessment Thoracic Assessment: Within Functional Limits Lumbar Assessment Lumbar Assessment: Within Functional Limits Postural Control Postural Control: Deficits on evaluation  Balance Balance Balance Assessed: Yes Static Sitting Balance Static Sitting - Balance Support: Feet supported Static Sitting - Level of Assistance: 5: Stand by assistance Dynamic Sitting Balance Dynamic Sitting - Balance Support: Feet supported Dynamic Sitting - Level of Assistance: 4: Min assist Sitting balance - Comments: Pt was able to remain at midline but required BUE support Static Standing Balance Static Standing - Balance Support: During functional activity Static Standing - Level of Assistance: 4: Min assist Dynamic Standing Balance Dynamic Standing - Balance Support: During functional activity Dynamic Standing - Level of Assistance: 4: Min assist Extremity Assessment  RUE Assessment RUE Assessment: Exceptions to [facility] RUE Strength RUE Overall Strength: Deficits Right Shoulder Flexion: 4-/5 Right Shoulder Extension: 4/5 Right Shoulder ABduction: 4-/5 Right Shoulder Internal Rotation: 4/5 Right Shoulder External Rotation: 4-/5 Right Shoulder Horizontal ABduction: 4-/5 Right Shoulder Horizontal ADduction: 4-/5 Right Elbow Flexion: 4/5 Right Elbow Extension: 4-/5 Right Forearm Pronation: 4-/5 Right Forearm Supination: 4-/5 Right Wrist Flexion: 4/5 Right Wrist Extension: 4/5 Right Wrist Radial Deviation: 4-/5 Right Wrist Ulnar Deviation: 4-/5 Right Hand Grip (lbs): 40lb Right Hand Lateral Pinch: 12 lbs Right Hand 3 Point Pinch: 8 lbs LUE Assessment LUE Assessment: Exceptions to [facility] LUE Strength LUE Overall Strength: Deficits Left Shoulder Flexion: 4-/5 Left Shoulder Extension: 4-/5 Left Shoulder ABduction: 4-/5 Left  Shoulder Internal Rotation: 4/5 Left Shoulder External Rotation: 4-/5 Left Shoulder Horizontal ABduction: 4/5 Left Shoulder Horizontal ADduction: 4/5 Left Elbow Flexion: 4/5 Left Elbow Extension: 4-/5 Left Forearm Pronation: 4-/5 Left Forearm Supination: 4-/5 Left Wrist Flexion: 4-/5 Left Wrist Extension: 4-/5 Left Wrist Radial Deviation: 4-/5 Left Wrist Ulnar Deviation: 4-/5 Left Hand Gross Grasp: Impaired Left Hand Grip (lbs): 42 Left Hand Lateral Pinch: 8 lbs Left Hand 3 Point Pinch: 6 lbs RLE Assessment RLE Assessment: Exceptions to [facility] General Strength Comments: grossly 5/5, but ataxia LLE Assessment LLE Assessment: Exceptions to [facility] General Strength Comments: grossly 4+/5 but ataxic  Care Tool Care Tool Bed Mobility Roll left and right activity   Roll left and right assist level: Supervision/Verbal cueing    Sit to lying activity   Sit to lying assist level: Contact Guard/Touching assist    Lying to sitting on side of bed activity   Lying to sitting on side of bed assist level: the ability to move from lying  on the back to sitting on the side of the bed with no back support.: Supervision/Verbal cueing     Care Tool Transfers Sit to stand transfer   Sit to stand assist level: Minimal Assistance - Patient > 75%    Chair/bed transfer   Chair/bed transfer assist level: Minimal Assistance - Patient > 75%    Car transfer   Car transfer assist level: Minimal Assistance - Patient > 75%      Care Tool Locomotion Ambulation   Assist level: Minimal Assistance - Patient > 75% Assistive device: Walker-rolling Max distance: 15 ft  Walk 10 feet activity   Assist level: Minimal Assistance - Patient > 75% Assistive device: Walker-rolling   Walk 50 feet with 2 turns activity Walk 50 feet with 2 turns activity did not occur: Safety/medical concerns      Walk 150 feet activity Walk 150 feet activity did not occur: Safety/medical concerns      Walk 10 feet on uneven  surfaces activity Walk 10 feet on uneven surfaces activity did not occur: Safety/medical concerns      Stairs   Assist level: Moderate Assistance - Patient - 50 - 74% Stairs assistive device: 2 hand rails Max number of stairs: 4  Walk up/down 1 step activity   Walk up/down 1 step (curb) assist level: Moderate Assistance - Patient - 50 - 74% Walk up/down 1 step or curb assistive device: 2 hand rails  Walk up/down 4 steps activity   Walk up/down 4 steps assist level: Moderate Assistance - Patient - 50 - 74% Walk up/down 4 steps assistive device: 2 hand rails  Walk up/down 12 steps activity Walk up/down 12 steps activity did not occur: Safety/medical concerns      Pick up small objects from floor   Pick up small object from the floor assist level: Maximal Assistance - Patient 25 - 49%    Wheelchair Is the patient using a wheelchair?: Yes Type of Wheelchair: Manual   Wheelchair assist level: Supervision/Verbal cueing Max wheelchair distance: 150 ft  Wheel 50 feet with 2 turns activity      Wheel 150 feet activity   Assist Level: Supervision/Verbal cueing    Refer to Care Plan for Long Term Goals  SHORT TERM GOAL WEEK 1 PT Short Term Goal 1 (Week 1): pt will ambulate x 30 ft with LRAD PT Short Term Goal 2 (Week 1): Pt will navigate steps with min A PT Short Term Goal 3 (Week 1): pt will tolerate >5 min of standing exercise  Recommendations for other services: None   Skilled Therapeutic Intervention Mobility Bed Mobility Bed Mobility: Supine to Sit Supine to Sit: Supervision/Verbal cueing Transfers Transfers: Sit to Stand;Stand Pivot Transfers Sit to Stand: Minimal Assistance - Patient > 75% Stand Pivot Transfers: Minimal Assistance - Patient > 75% Stand Pivot Transfer Details: Verbal cues for technique;Verbal cues for gait pattern;Verbal cues for precautions/safety;Verbal cues for safe use of DME/AE Transfer (Assistive device): Rolling walker Locomotion   Gait Ambulation: Yes Gait Assistance: Minimal Assistance - Patient > 75% Gait Distance (Feet): 15 Feet Assistive device: Rolling walker Gait Assistance Details: Verbal cues for safe use of DME/AE;Verbal cues for precautions/safety;Verbal cues for technique;Verbal cues for gait pattern Gait Gait: Yes Gait Pattern: Ataxic;Step-through pattern Stairs / Additional Locomotion Stairs: Yes Stairs Assistance: Moderate Assistance - Patient 50 - 74% Stair Management Technique: Two rails;Step to pattern Number of Stairs: 4 Height of Stairs: 6 Wheelchair Mobility Wheelchair Mobility: Yes Wheelchair Assistance: Doctor, general practice: Both  upper extremities Wheelchair Parts Management: Supervision/cueing Distance: 150 ft  Evaluation completed (see details above) with patient education regarding purpose of PT evaluation, PT POC and goals, therapy schedule, weekly team meetings, and other CIR information including safety plan and fall risk safety. Pt performed the above functional mobility tasks with the specified levels of skilled cuing and assistance.  Returned to recliner at end of session, was left with all needs in reach and alarm active.    Discharge Criteria: Patient will be discharged from PT if patient refuses treatment 3 consecutive times without medical reason, if treatment goals not met, if there is a change in medical status, if patient makes no progress towards goals or if patient is discharged from hospital.  The above assessment, treatment plan, treatment alternatives and goals were discussed and mutually agreed upon: by patient  Juluis Rainier 08/13/2023, 3:57 PM

## 2023-08-14 DIAGNOSIS — G610 Guillain-Barre syndrome: Secondary | ICD-10-CM | POA: Diagnosis not present

## 2023-08-14 MED ORDER — AMLODIPINE BESYLATE 5 MG PO TABS
5.0000 mg | ORAL_TABLET | Freq: Every day | ORAL | Status: DC
  Administered 2023-08-15 – 2023-08-19 (×5): 5 mg via ORAL
  Filled 2023-08-14 (×5): qty 1

## 2023-08-14 NOTE — Plan of Care (Signed)
  Problem: Consults Goal: RH GENERAL PATIENT EDUCATION Description: See Patient Education module for education specifics. Outcome: Progressing   Problem: RH BOWEL ELIMINATION Goal: RH STG MANAGE BOWEL WITH ASSISTANCE Description: STG Manage Bowel with min Assistance. Outcome: Progressing Goal: RH STG MANAGE BOWEL W/MEDICATION W/ASSISTANCE Description: STG Manage Bowel with Medication with min Assistance. Outcome: Progressing   Problem: RH BLADDER ELIMINATION Goal: RH STG MANAGE BLADDER WITH ASSISTANCE Description: STG Manage Bladder With min Assistance Outcome: Progressing Goal: RH STG MANAGE BLADDER WITH MEDICATION WITH ASSISTANCE Description: STG Manage Bladder With Medication With min Assistance. Outcome: Progressing   Problem: RH SKIN INTEGRITY Goal: RH STG SKIN FREE OF INFECTION/BREAKDOWN Description: Skin will remain intact and be free of infection/breakdown with min assist  Outcome: Progressing   Problem: RH SAFETY Goal: RH STG ADHERE TO SAFETY PRECAUTIONS W/ASSISTANCE/DEVICE Description: STG Adhere to Safety Precautions With min Assistance/Device. Outcome: Progressing Goal: RH STG DECREASED RISK OF FALL WITH ASSISTANCE Description: STG Decreased Risk of Fall With cueing Assistance. Outcome: Progressing   Problem: RH PAIN MANAGEMENT Goal: RH STG PAIN MANAGED AT OR BELOW PT'S PAIN GOAL Description: Less than 4 with PRN medications min assist  Outcome: Progressing   Problem: RH KNOWLEDGE DEFICIT GENERAL Goal: RH STG INCREASE KNOWLEDGE OF SELF CARE AFTER HOSPITALIZATION Description: Patient will be able to manage medications, self care, diet/lifestyle modifications to improve health from nursing education and nursing handouts independently Outcome: Progressing

## 2023-08-14 NOTE — Progress Notes (Signed)
Occupational Therapy Session Note  Patient Details  Name: Jill Allison MRN: [id number] Date of Birth: 10-22-1960  {CHL IP REHAB OT TIME CALCULATIONS:[id number]}   Short Term Goals: Week 1:  OT Short Term Goal 1 (Week 1): Patient to perform toileting with SBA OT Short Term Goal 2 (Week 1): Patient to perform LE dressing with set up assist OT Short Term Goal 3 (Week 1): Patient to perform grooming standing at the sink with close SBA OT Short Term Goal 4 (Week 1): Patient to perform bathing with close SBA  Skilled Therapeutic Interventions/Progress Updates:  Pt received *** for skilled OT session with focus on ***. Pt agreeable to interventions, demonstrating overall *** mood. Pt reported ***/10 pain, stating "***" in reference to ***. OT offering intermediate rest breaks and positioning suggestions throughout session to address pain/fatigue and maximize participation/safety in session.    Pt remained *** with all immediate needs met at end of session. Pt continues to be appropriate for skilled OT intervention to promote further functional independence.   Therapy Documentation Precautions:  Precautions Precautions: Fall Precaution Comments: ataxia Restrictions Weight Bearing Restrictions: No   Therapy/Group: Individual Therapy  Lou Cal, OTR/L, MSOT  08/14/2023, 3:21 PM

## 2023-08-14 NOTE — Progress Notes (Signed)
PROGRESS NOTE   Subjective/Complaints: No new complaints this morning Patient's chart reviewed- No issues reported overnight Hypotensive, amlodipine decreased to 5mg    ROS:  Pt denies SOB, abd pain, CP, N/V/C/D, and vision changes   Objective:   No results found. Recent Labs    08/12/23 1945 08/13/23 0614  WBC 3.5* 2.6*  HGB 11.1* 11.0*  HCT 35.9* 35.9*  PLT 243 206   Recent Labs    08/13/23 0614  NA 138  K 4.4  CL 101  CO2 27  GLUCOSE 101*  BUN 30*  CREATININE 0.74  CALCIUM 9.1    Intake/Output Summary (Last 24 hours) at 08/14/2023 1353 Last data filed at 08/14/2023 1300 Gross per 24 hour  Intake 720 ml  Output --  Net 720 ml        Physical Exam: Vital Signs Blood pressure (!) 99/50, pulse 92, temperature 98.8 F (37.1 C), resp. rate 16, height 5\' 6"  (1.676 m), weight 71.2 kg, last menstrual period 10/19/2008, SpO2 95%.    General: awake, alert, appropriate, NAD HENT: conjugate gaze; oropharynx moist CV: regular rate; no JVD Pulmonary: CTA B/L; no W/R/R- good air movement GI: soft, NT, ND, (+)BS Psychiatric: appropriate Neurological: Ox3 Musculoskeletal:     Cervical back: Neck supple. No tenderness.     Comments: Deltoids 4/5; Biceps 4+/5; Triceps 4/5; WE 5-/5; Grip 5-/5 and FA 4+/5 HF 4 to 4+/5; KE 4/5 KF 4+/5; DF 4+/5 and PF 5-/5 all B/L, exam stable 10/26 Skin:    General: Skin is warm and dry.     Comments: Scattered erythematous patches on back Has R wrist iv- looks OK  Neurological:     Mental Status: She is alert and oriented to person, place, and time.     Comments: Intact to light touch in all 4 extremities Ox3 Absent DTR  Assessment/Plan: 1. Functional deficits which require 3+ hours per day of interdisciplinary therapy in a comprehensive inpatient rehab setting. Physiatrist is providing close team supervision and 24 hour management of active medical problems listed  below. Physiatrist and rehab team continue to assess barriers to discharge/monitor patient progress toward functional and medical goals  Care Tool:  Bathing    Body parts bathed by patient: Right arm, Left arm, Chest, Abdomen, Right upper leg, Buttocks, Front perineal area, Left upper leg, Right lower leg, Left lower leg, Face         Bathing assist Assist Level: Minimal Assistance - Patient > 75%     Upper Body Dressing/Undressing Upper body dressing   What is the patient wearing?: Pull over shirt    Upper body assist Assist Level: Minimal Assistance - Patient > 75%    Lower Body Dressing/Undressing Lower body dressing      What is the patient wearing?: Underwear/pull up, Pants     Lower body assist Assist for lower body dressing: Minimal Assistance - Patient > 75%     Toileting Toileting    Toileting assist Assist for toileting: Minimal Assistance - Patient > 75%     Transfers Chair/bed transfer  Transfers assist     Chair/bed transfer assist level: Minimal Assistance - Patient > 75%  Locomotion Ambulation   Ambulation assist      Assist level: Minimal Assistance - Patient > 75% Assistive device: Walker-rolling Max distance: 15 ft   Walk 10 feet activity   Assist     Assist level: Minimal Assistance - Patient > 75% Assistive device: Walker-rolling   Walk 50 feet activity   Assist Walk 50 feet with 2 turns activity did not occur: Safety/medical concerns         Walk 150 feet activity   Assist Walk 150 feet activity did not occur: Safety/medical concerns         Walk 10 feet on uneven surface  activity   Assist Walk 10 feet on uneven surfaces activity did not occur: Safety/medical concerns         Wheelchair     Assist Is the patient using a wheelchair?: Yes Type of Wheelchair: Manual    Wheelchair assist level: Supervision/Verbal cueing Max wheelchair distance: 150 ft    Wheelchair 50 feet with 2 turns  activity    Assist            Wheelchair 150 feet activity     Assist      Assist Level: Supervision/Verbal cueing   Blood pressure (!) 99/50, pulse 92, temperature 98.8 F (37.1 C), resp. rate 16, height 5\' 6"  (1.676 m), weight 71.2 kg, last menstrual period 10/19/2008, SpO2 95%.   Medical Problem List and Plan: 1. 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  Antithrombotics: -DVT/anticoagulation:  Pharmaceutical: Lovenox             -antiplatelet therapy: Aspirin 81 mg   3. Pain Management: continue Tylenol, Robaxin, oxycodone as needed   4. Mood/Behavior/Sleep: LCSW to evaluate and provide emotional support             -continue Paxil              -antipsychotic agents: n/a   5. Neuropsych/cognition: This patient is capable of making decisions on her own behalf.   6. Skin/Wound Care: Routine skin care checks             -skin rash on back>>Sarna   7. Fluids/Electrolytes/Nutrition: Routine Is and Os and follow-up chemistries             -continue B12 supplementation   8: Hypertension: monitor TID and prn             -amlodipine decreased to 5mg  for hypotension             -continue losartan 50 mg daily   10/25- BP controlled- con't regimen  10: Hypokalemia: received 40 mEq today -recheck BMP in am   10/25- K_ up to 4.4 con't to monitor  11: Elevated BUN: encourage oral hydration and follow-up BMP in am- was drinking Coke - switched to water   10/25- BMP shows mild improvement in BUN- will con't to push fluids   12. Hypotension: amlodipine decreased to 5mg         LOS: 2 days A FACE TO FACE EVALUATION WAS PERFORMED  Clint Bolder P Krish Bailly 08/14/2023, 1:53 PM

## 2023-08-15 DIAGNOSIS — G610 Guillain-Barre syndrome: Secondary | ICD-10-CM | POA: Diagnosis not present

## 2023-08-15 NOTE — Progress Notes (Signed)
PROGRESS NOTE   Subjective/Complaints: No new complaints this morning Sitting in chair reading book Tolerated therapy well No issues overnight   ROS:  Pt denies SOB, abd pain, CP, N/V/C/D, and vision changes   Objective:   No results found. Recent Labs    08/12/23 1945 08/13/23 0614  WBC 3.5* 2.6*  HGB 11.1* 11.0*  HCT 35.9* 35.9*  PLT 243 206   Recent Labs    08/13/23 0614  NA 138  K 4.4  CL 101  CO2 27  GLUCOSE 101*  BUN 30*  CREATININE 0.74  CALCIUM 9.1    Intake/Output Summary (Last 24 hours) at 08/15/2023 1213 Last data filed at 08/15/2023 0831 Gross per 24 hour  Intake 600 ml  Output --  Net 600 ml        Physical Exam: Vital Signs Blood pressure 129/63, pulse 78, temperature 98 F (36.7 C), resp. rate 17, height 5\' 6"  (1.676 m), weight 71.2 kg, last menstrual period 10/19/2008, SpO2 97%.    General: awake, alert, appropriate, NAD HENT: conjugate gaze; oropharynx moist CV: regular rate; no JVD Pulmonary: CTA B/L; no W/R/R- good air movement GI: soft, NT, ND, (+)BS Psychiatric: appropriate Neurological: Ox3 Musculoskeletal:     Cervical back: Neck supple. No tenderness.     Comments: Deltoids 4/5; Biceps 4+/5; Triceps 4/5; WE 5-/5; Grip 5-/5 and FA 4+/5 HF 4 to 4+/5; KE 4/5 KF 4+/5; DF 4+/5 and PF 5-/5 all B/L, exam stable 10/26 Skin:    General: Skin is warm and dry.     Comments: Scattered erythematous patches on back Has R wrist iv- looks OK  Neurological:     Mental Status: She is alert and oriented to person, place, and time.     Comments: Intact to light touch in all 4 extremities, stable 10/27 Ox3 Absent DTR  Assessment/Plan: 1. Functional deficits which require 3+ hours per day of interdisciplinary therapy in a comprehensive inpatient rehab setting. Physiatrist is providing close team supervision and 24 hour management of active medical problems listed  below. Physiatrist and rehab team continue to assess barriers to discharge/monitor patient progress toward functional and medical goals  Care Tool:  Bathing    Body parts bathed by patient: Right arm, Left arm, Chest, Abdomen, Right upper leg, Buttocks, Front perineal area, Left upper leg, Right lower leg, Left lower leg, Face         Bathing assist Assist Level: Minimal Assistance - Patient > 75%     Upper Body Dressing/Undressing Upper body dressing   What is the patient wearing?: Pull over shirt    Upper body assist Assist Level: Minimal Assistance - Patient > 75%    Lower Body Dressing/Undressing Lower body dressing      What is the patient wearing?: Underwear/pull up, Pants     Lower body assist Assist for lower body dressing: Minimal Assistance - Patient > 75%     Toileting Toileting    Toileting assist Assist for toileting: Minimal Assistance - Patient > 75%     Transfers Chair/bed transfer  Transfers assist     Chair/bed transfer assist level: Minimal Assistance - Patient > 75%  Locomotion Ambulation   Ambulation assist      Assist level: Minimal Assistance - Patient > 75% Assistive device: Walker-rolling Max distance: 15 ft   Walk 10 feet activity   Assist     Assist level: Minimal Assistance - Patient > 75% Assistive device: Walker-rolling   Walk 50 feet activity   Assist Walk 50 feet with 2 turns activity did not occur: Safety/medical concerns         Walk 150 feet activity   Assist Walk 150 feet activity did not occur: Safety/medical concerns         Walk 10 feet on uneven surface  activity   Assist Walk 10 feet on uneven surfaces activity did not occur: Safety/medical concerns         Wheelchair     Assist Is the patient using a wheelchair?: Yes Type of Wheelchair: Manual    Wheelchair assist level: Supervision/Verbal cueing Max wheelchair distance: 150 ft    Wheelchair 50 feet with 2 turns  activity    Assist            Wheelchair 150 feet activity     Assist      Assist Level: Supervision/Verbal cueing   Blood pressure 129/63, pulse 78, temperature 98 F (36.7 C), resp. rate 17, height 5\' 6"  (1.676 m), weight 71.2 kg, last menstrual period 10/19/2008, SpO2 97%.   Medical Problem List and Plan: 1. 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  Antithrombotics: -DVT/anticoagulation:  Pharmaceutical: Lovenox             -antiplatelet therapy: Aspirin 81 mg   3. Pain Management: continue Tylenol, Robaxin, oxycodone as needed   4. Mood/Behavior/Sleep: LCSW to evaluate and provide emotional support             -continue Paxil              -antipsychotic agents: n/a   5. Neuropsych/cognition: This patient is capable of making decisions on her own behalf.   6. Skin/Wound Care: Routine skin care checks             -skin rash on back>>Sarna   7. Fluids/Electrolytes/Nutrition: Routine Is and Os and follow-up chemistries             -continue B12 supplementation   8: Hypertension: monitor TID and prn             -amlodipine decreased to 5mg  for hypotension             -continue losartan 50 mg daily  10: Hypokalemia: received 40 mEq today -recheck BMP in am   10/25- K_ up to 4.4 con't to monitor  11: Elevated BUN: encourage oral hydration and follow-up BMP in am- was drinking Coke - switched to water   10/25- BMP shows mild improvement in BUN- will con't to push fluids   12. Hypotension: amlodipine decreased to 5mg , resolved, continue this dose.         LOS: 3 days A FACE TO FACE EVALUATION WAS PERFORMED  Drema Pry Chatara Lucente 08/15/2023, 12:13 PM

## 2023-08-15 NOTE — Progress Notes (Signed)
Occupational Therapy Session Note  Patient Details  Name: Jill Allison MRN: [id number] Date of Birth: 1961/03/21  Today's Date: 08/15/2023 OT Individual Time: 0900-1010 OT Individual Time Calculation (min): 70 min    Short Term Goals: Week 1:  OT Short Term Goal 1 (Week 1): Patient to perform toileting with SBA OT Short Term Goal 2 (Week 1): Patient to perform LE dressing with set up assist OT Short Term Goal 3 (Week 1): Patient to perform grooming standing at the sink with close SBA OT Short Term Goal 4 (Week 1): Patient to perform bathing with close SBA  Skilled Therapeutic Interventions/Progress Updates:    Pt resting in recliner upon arrival. Pt requested to use bathroom. Supervision for amb in room wihtout AD to access bathroom. Supervision for toileting. OT intervention with focus on amb without AD, standing balance, BUE gross motor/FMC, discharge planning, and safety awareness to increase independence with BADLs. Amb throughout all areas of unit wihtout AD and supervision/CGA. Standing activity at [facility] on compliant and non-compliant surface with close supervision. Pt gathered items off floor and placed in dirty linen bag. Pt carried 4# bar in BUE when returning to room to simulate carrying laundry basket. 9 hole peg test per below  9 Hole Peg Test is used to measure finger dexterity in pts with various neurological diagnoses. - Instructions The pt was instructed to pick up the pegs one at a time, using their dominant hand first and put them into the holes in any order until the holes were all filled. The pt then removed the pegs one at a time and returned them to the container. Both hands were tested separately.  - Results RUE The pt completed the test in 26.03 seconds.  LUE The pt completed the test in 24.42 seconds. Scores are based on the time taken to complete the activity. The timer started the moment the pt touched the first peg until the moment the last peg hit  the container.   - Norms for healthy females ages 19-70+ 20-55 R 17.38 L 18.92 56-60 R 17.86 L 19.48 61-65 R 18.99 L 20.33 66-70 R 19.90 L 21.44 71+ R 22.49 L 24.11  Pt remained in recliner with all needs within reach. Seat alarm activated.     Therapy Documentation Precautions:  Precautions Precautions: Fall Precaution Comments: ataxia Restrictions Weight Bearing Restrictions: No   Pain: Pt denies pain but c/o BUE/hand numbness (slowly improving)  Therapy/Group: Individual Therapy  Rich Brave 08/15/2023, 10:16 AM

## 2023-08-15 NOTE — Progress Notes (Signed)
Physical Therapy Session Note  Patient Details  Name: Jill Allison MRN: [id number] Date of Birth: [birth year]  Today's Date: 08/15/2023 PT Individual Time: 1120-1200 PT Individual Time Calculation (min): 40 min   Short Term Goals: Week 1:  PT Short Term Goal 1 (Week 1): pt will ambulate x 30 ft with LRAD PT Short Term Goal 2 (Week 1): Pt will navigate steps with min A PT Short Term Goal 3 (Week 1): pt will tolerate >5 min of standing exercise  Skilled Therapeutic Interventions/Progress Updates:      Therapy Documentation Precautions:  Precautions Precautions: Fall Precaution Comments: ataxia Restrictions Weight Bearing Restrictions: No  Pt agreeable to PT session and declines pain. Pt supervision with gait ~300 ft hospital room <>dayroom. Patient demonstrates increased fall risk as noted by score of 27/30 on  Functional Gait Assessment.   <22/30 = predictive of falls, <20/30 = fall in 6 months, <18/30 = predictive of falls in PD MCID: 5 points stroke population, 4 points geriatric population (ANPTA Core Set of Outcome Measures for Adults with Neurologic Conditions, 2018)    08/15/23 1139  Standardized Balance Assessment  Standardized Balance Assessment Functional Gait Assessment  Functional Gait  Assessment  Gait assessed  Yes  Gait Level Surface 2  Change in Gait Speed 3  Gait with Horizontal Head Turns 2  Gait with Vertical Head Turns 3  Gait and Pivot Turn 3  Step Over Obstacle 3  Gait with Narrow Base of Support 2  Gait with Eyes Closed 3  Ambulating Backwards 3  Steps 3  Total Score 27   Pt navigated steps x 12 ( 8 inches) without rails and transitioned to performing step up's with simulated pocket bag (bag with 4# weight) on right shoulder while reaching contralaterally for horse shoes and placing on to target. Pt also demonstrated good balance with simulated pocket book while reaching down to pick up objects.   Pt ambulated ~100 ft while tossing ball up and  catching to challenge dynamic and reactive balance (supervision).   Pt returned to room and left seated at bedside with all needs in reach and alarm on.    Therapy/Group: Individual Therapy  Truitt Leep Truitt Leep PT, DPT  08/15/2023, 11:41 AM

## 2023-08-15 NOTE — Plan of Care (Signed)
  Problem: Consults Goal: RH GENERAL PATIENT EDUCATION Description: See Patient Education module for education specifics. Outcome: Progressing   Problem: RH BOWEL ELIMINATION Goal: RH STG MANAGE BOWEL WITH ASSISTANCE Description: STG Manage Bowel with min Assistance. Outcome: Progressing Goal: RH STG MANAGE BOWEL W/MEDICATION W/ASSISTANCE Description: STG Manage Bowel with Medication with min Assistance. Outcome: Progressing   Problem: RH BLADDER ELIMINATION Goal: RH STG MANAGE BLADDER WITH ASSISTANCE Description: STG Manage Bladder With min Assistance Outcome: Progressing Goal: RH STG MANAGE BLADDER WITH MEDICATION WITH ASSISTANCE Description: STG Manage Bladder With Medication With min Assistance. Outcome: Progressing   Problem: RH SKIN INTEGRITY Goal: RH STG SKIN FREE OF INFECTION/BREAKDOWN Description: Skin will remain intact and be free of infection/breakdown with min assist  Outcome: Progressing   Problem: RH SAFETY Goal: RH STG ADHERE TO SAFETY PRECAUTIONS W/ASSISTANCE/DEVICE Description: STG Adhere to Safety Precautions With min Assistance/Device. Outcome: Progressing Goal: RH STG DECREASED RISK OF FALL WITH ASSISTANCE Description: STG Decreased Risk of Fall With cueing Assistance. Outcome: Progressing   Problem: RH PAIN MANAGEMENT Goal: RH STG PAIN MANAGED AT OR BELOW PT'S PAIN GOAL Description: Less than 4 with PRN medications min assist  Outcome: Progressing   Problem: RH KNOWLEDGE DEFICIT GENERAL Goal: RH STG INCREASE KNOWLEDGE OF SELF CARE AFTER HOSPITALIZATION Description: Patient will be able to manage medications, self care, diet/lifestyle modifications to improve health from nursing education and nursing handouts independently Outcome: Progressing

## 2023-08-16 DIAGNOSIS — G610 Guillain-Barre syndrome: Secondary | ICD-10-CM | POA: Diagnosis not present

## 2023-08-16 LAB — CBC
HCT: 34.6 % — ABNORMAL LOW (ref 36.0–46.0)
Hemoglobin: 10.4 g/dL — ABNORMAL LOW (ref 12.0–15.0)
MCH: 25.1 pg — ABNORMAL LOW (ref 26.0–34.0)
MCHC: 30.1 g/dL (ref 30.0–36.0)
MCV: 83.4 fL (ref 80.0–100.0)
Platelets: 207 10*3/uL (ref 150–400)
RBC: 4.15 MIL/uL (ref 3.87–5.11)
RDW: 15.2 % (ref 11.5–15.5)
WBC: 2.4 10*3/uL — ABNORMAL LOW (ref 4.0–10.5)
nRBC: 0 % (ref 0.0–0.2)

## 2023-08-16 LAB — BASIC METABOLIC PANEL
Anion gap: 4 — ABNORMAL LOW (ref 5–15)
BUN: 14 mg/dL (ref 8–23)
CO2: 28 mmol/L (ref 22–32)
Calcium: 8.6 mg/dL — ABNORMAL LOW (ref 8.9–10.3)
Chloride: 105 mmol/L (ref 98–111)
Creatinine, Ser: 0.79 mg/dL (ref 0.44–1.00)
GFR, Estimated: >60 mL/min (ref >60)
Glucose, Bld: 95 mg/dL (ref 70–99)
Potassium: 4.2 mmol/L (ref 3.5–5.1)
Sodium: 137 mmol/L (ref 135–145)

## 2023-08-16 NOTE — Progress Notes (Signed)
Physical Therapy Session Note  Patient Details  Name: Jill Allison MRN: [id number] Date of Birth: 1961/07/02  Today's Date: 08/16/2023 PT Individual Time: 0915-1030, 1345-1430 PT Individual Time Calculation (min): 75 min,  45 min   Short Term Goals: Week 1:  PT Short Term Goal 1 (Week 1): pt will ambulate x 30 ft with LRAD PT Short Term Goal 2 (Week 1): Pt will navigate steps with min A PT Short Term Goal 3 (Week 1): pt will tolerate >5 min of standing exercise  Skilled Therapeutic Interventions/Progress Updates:    Session 1: Pt received in recliner and agreeable to therapy.  No complaint of pain.   Discussed AD, pt reports she did not use RW most of weekend. Assessed gait with and without RW, pt ambulates well with no LOB and appropriate speed without AD, but CGA vs supervision with RW. Pt then provided with and educated on rollator. Rollator likely to be most appropriate at d/c. Discussed that pt will likely not need in the home, but will use in the community for longer distances and unfamiliar environments.   Pt then performed floor transfer with CGA. Education on fall recovery and when to activate EMS. Pt owns apple watch, discussed wearing consistently for safety in case of falls. Pt then performed 4 x 10 tall kneeling<>stand with CGA, alternating BLE for endurance and LE strength.   Progressed to side stepping with RTB around feet for increased glute med engagement. X 4 bouts.   On return to room, pt noted to have x 2 small knee buckles but did not require assist. Returned to recliner and was left with all needs in reach and alarm active.   Session 2: Pt received in recliner and agreeable to therapy.  No complaint of pain. ambulatory transfer to bathroom with supervision for continent bladder void.  Session focused on gait in community environments. Pt ambulated to [facility] entrance with rollator, CGA-supervision. Pt navigated slopes and grass without difficulty, and gait >5 min  without a rest break. Pt returned to room and to recliner, continued d/c planning and education on AD use. Pt remained in recliner and was left with all needs in reach and alarm active.   Therapy Documentation Precautions:  Precautions Precautions: Fall Precaution Comments: ataxia Restrictions Weight Bearing Restrictions: No General:       Therapy/Group: Individual Therapy  Juluis Rainier 08/16/2023, 3:52 PM

## 2023-08-16 NOTE — Progress Notes (Signed)
PROGRESS NOTE   Subjective/Complaints:  Pt reports muscle tightness in back- tylenol and muscle relaxant have been helpful- esp at bedtime.  Hard to get comfortable without meds.  Walked without RW yesterday LBM x2 yesterday- usually has Small Bms- that's her normal.     ROS:   Pt denies SOB, abd pain, CP, N/V/C/D, and vision changes   Objective:   No results found. Recent Labs    08/16/23 0716  WBC 2.4*  HGB 10.4*  HCT 34.6*  PLT 207   Recent Labs    08/16/23 0716  NA 137  K 4.2  CL 105  CO2 28  GLUCOSE 95  BUN 14  CREATININE 0.79  CALCIUM 8.6*    Intake/Output Summary (Last 24 hours) at 08/16/2023 0820 Last data filed at 08/16/2023 0750 Gross per 24 hour  Intake 600 ml  Output --  Net 600 ml        Physical Exam: Vital Signs Blood pressure (!) 114/42, pulse 83, temperature 98.1 F (36.7 C), resp. rate 18, height 5\' 6"  (1.676 m), weight 71.2 kg, last menstrual period 10/19/2008, SpO2 98%.     General: awake, alert, appropriate, sitting u in bed finished muffin; NAD HENT: conjugate gaze; oropharynx moist CV: regular rate and rhythm; no JVD Pulmonary: CTA B/L; no W/R/R- good air movement GI: soft, NT, ND, (+)BS Psychiatric: appropriate- but slightly flat Neurological: Ox3' Musculoskeletal:     Cervical back: Neck supple. No tenderness.     Comments: Deltoids 4/5; Biceps 4+/5; Triceps 4/5; WE 5-/5; Grip 5-/5 and FA 4+/5 HF 4 to 4+/5; KE 4/5 KF 4+/5; DF 4+/5 and PF 5-/5 all B/L, exam stable 10/26 Skin:    General: Skin is warm and dry.     Comments: Scattered erythematous patches on back Has R wrist iv- looks OK  Neurological:     Mental Status: She is alert and oriented to person, place, and time.     Comments: Intact to light touch in all 4 extremities, stable 10/27 Ox3 Absent DTR  Assessment/Plan: 1. Functional deficits which require 3+ hours per day of interdisciplinary therapy  in a comprehensive inpatient rehab setting. Physiatrist is providing close team supervision and 24 hour management of active medical problems listed below. Physiatrist and rehab team continue to assess barriers to discharge/monitor patient progress toward functional and medical goals  Care Tool:  Bathing    Body parts bathed by patient: Right arm, Left arm, Chest, Abdomen, Right upper leg, Buttocks, Front perineal area, Left upper leg, Right lower leg, Left lower leg, Face         Bathing assist Assist Level: Supervision/Verbal cueing     Upper Body Dressing/Undressing Upper body dressing   What is the patient wearing?: Pull over shirt    Upper body assist Assist Level: Set up assist    Lower Body Dressing/Undressing Lower body dressing      What is the patient wearing?: Underwear/pull up, Pants     Lower body assist Assist for lower body dressing: Supervision/Verbal cueing     Toileting Toileting    Toileting assist Assist for toileting: Minimal Assistance - Patient > 75%     Transfers Chair/bed  transfer  Transfers assist     Chair/bed transfer assist level: Minimal Assistance - Patient > 75%     Locomotion Ambulation   Ambulation assist      Assist level: Minimal Assistance - Patient > 75% Assistive device: Walker-rolling Max distance: 15 ft   Walk 10 feet activity   Assist     Assist level: Minimal Assistance - Patient > 75% Assistive device: Walker-rolling   Walk 50 feet activity   Assist Walk 50 feet with 2 turns activity did not occur: Safety/medical concerns         Walk 150 feet activity   Assist Walk 150 feet activity did not occur: Safety/medical concerns         Walk 10 feet on uneven surface  activity   Assist Walk 10 feet on uneven surfaces activity did not occur: Safety/medical concerns         Wheelchair     Assist Is the patient using a wheelchair?: Yes Type of Wheelchair: Manual    Wheelchair  assist level: Supervision/Verbal cueing Max wheelchair distance: 150 ft    Wheelchair 50 feet with 2 turns activity    Assist            Wheelchair 150 feet activity     Assist      Assist Level: Supervision/Verbal cueing   Blood pressure (!) 114/42, pulse 83, temperature 98.1 F (36.7 C), resp. rate 18, height 5\' 6"  (1.676 m), weight 71.2 kg, last menstrual period 10/19/2008, SpO2 98%.   Medical Problem List and Plan: 1. 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  Antithrombotics: -DVT/anticoagulation:  Pharmaceutical: Lovenox             -antiplatelet therapy: Aspirin 81 mg   3. Pain Management: continue Tylenol, Robaxin, oxycodone as needed   10/28- said only taking Robaxin and tylenol- no sign on MAR that's taken Oxy since came to rehab 4. Mood/Behavior/Sleep: LCSW to evaluate and provide emotional support             -continue Paxil              -antipsychotic agents: n/a   5. Neuropsych/cognition: This patient is capable of making decisions on her own behalf.   6. Skin/Wound Care: Routine skin care checks             -skin rash on back>>Sarna   7. Fluids/Electrolytes/Nutrition: Routine Is and Os and follow-up chemistries             -continue B12 supplementation   8: Hypertension: monitor TID and prn             -amlodipine decreased to 5mg  for hypotension             -continue losartan 50 mg daily  10: Hypokalemia: received 40 mEq today -recheck BMP in am   10/25- K_ up to 4.4 con't to monitor  11: Elevated BUN: encourage oral hydration and follow-up BMP in am- was drinking Coke - switched to water   10/25- BMP shows mild improvement in BUN- will con't to push fluids   12. Hypotension: amlodipine decreased to 5mg , resolved, continue this dose.    1-28- BP well controlled- running 110s to 130s with reduction of Norvasc.  LOS: 4 days A FACE  TO FACE EVALUATION WAS PERFORMED  Jill Allison 08/16/2023, 8:20 AM

## 2023-08-16 NOTE — Progress Notes (Signed)
Occupational Therapy Session Note  Patient Details  Name: Jill Allison MRN: [id number] Date of Birth: 19-Jan-1961  Today's Date: 08/16/2023 OT Individual Time: 1191-4782 OT Individual Time Calculation (min): 70 min    Short Term Goals: Week 1:  OT Short Term Goal 1 (Week 1): Patient to perform toileting with SBA OT Short Term Goal 2 (Week 1): Patient to perform LE dressing with set up assist OT Short Term Goal 3 (Week 1): Patient to perform grooming standing at the sink with close SBA OT Short Term Goal 4 (Week 1): Patient to perform bathing with close SBA  Skilled Therapeutic Interventions/Progress Updates:    Pt resting in recliner upon arrival. Pt awaiting clean clothes from husband and prefers to wait for clean clothes before taking shower. OT intervention with focus on functional amb with/without AD, higher level balance tasks, activity tolerance, energy conservation, and safety awareness to increase independence with BADLs. Pt amb with Rollator down to [employer] and gift shop. Pt amb with Rollator in gift shop after rest break. Pt returned to gym and tossed 1kg ball against mini trampoline on complaitan/noncompliant surface with no LOB noted. Pt gathered linens from cart, placed in laundry basket, carried to ortho gym, and placed all items in linen bag, and returned to gym. Pt kicked/chased Hoverball in hallway without LOB. Pt amb while tossing ball in hallway with no LOB. Pt returned to room with Rollator. Pt returned to recliner with all needs wihtin reach. Seat alarm activated.   Therapy Documentation Precautions:  Precautions Precautions: Fall Precaution Comments: ataxia Restrictions Weight Bearing Restrictions: No   Pain: Pt denies pain this morning  Therapy/Group: Individual Therapy  Rich Brave 08/16/2023, 12:02 PM

## 2023-08-16 NOTE — Discharge Summary (Signed)
Physician Discharge Summary  Patient ID: Jill Allison MRN: [id number] DOB/AGE: 63-Oct-1962 89 y.o.  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  Labs:  Basic Metabolic Panel: Recent Labs  Lab 08/11/23 0522 08/13/23 0614 08/16/23 0716  NA 134* 138 137  K 3.3* 4.4 4.2  CL 101 101 105  CO2 27 27 28   GLUCOSE 97 101* 95  BUN 34* 30* 14  CREATININE 0.67 0.74 0.79  CALCIUM 8.8* 9.1 8.6*    CBC: Recent Labs  Lab 08/12/23 1945 08/13/23 0614 08/16/23 0716  WBC 3.5* 2.6* 2.4*  NEUTROABS  --  1.3*  --   HGB 11.1* 11.0* 10.4*  HCT 35.9* 35.9* 34.6*  MCV 83.7 82.7 83.4  PLT 243 206 207     Brief HPI:   Jill Allison is a 62 y.o. female who presented to department on 08/06/2023 complaining of increasing pain and weakness in the extremities bilaterally.  She reported a history of COVID infection approximately 2 weeks prior to admission.  She was recently treated for upper respiratory infection and placed on antibiotics.  On presentation, she described pain in both feet, numbness and tingling in both hands.  Vitamin B12 level 200.  Lumbar puncture was performed which showed elevated albumin, low suspicion for meningitis/encephalitis.  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  She was started on IVIG and completed 5-day course.  Over the course of her hospitalization, she was noted to have elevated blood pressure readings and started on amlodipine and losartan. Has undergone PT/OT evals.    Hospital Course: RAGAD ROTHROCK was admitted to rehab 08/12/2023 for inpatient therapies to consist of PT, ST and OT at least three hours five  days a week. Past admission physiatrist, therapy team and rehab RN have worked together to provide customized collaborative inpatient rehab.  Follow-up labs 10/25 with mild improvement in BUN and continue to push oral fluids.  Serum potassium 4.4.  Back pain controlled with Tylenol and Robaxin.   Blood pressures were monitored on TID basis and amlodipine 10 mg daily and losartan 50 mg daily continued.  Amlodipine decreased to 5 mg daily due to hypotension.  Rehab course: During patient's stay in rehab weekly team conferences were held to monitor patient's progress, set goals and discuss barriers to discharge. At admission, patient required min assist with basic self-care skills and with mobility.  She has had improvement in activity tolerance, balance, postural control as well as ability to compensate for deficits. She has had improvement in functional use RUE/LUE  and RLE/LLE as well as improvement in awareness       Disposition:  There are no questions and answers to display.         Diet: Regular  Special Instructions: No driving, alcohol consumption or tobacco use.   Allergies as of 08/16/2023   No Known Allergies   Med Rec must be completed prior to using this [facility]***        Signed: Milinda Antis 08/16/2023, 4:57 PM

## 2023-08-17 DIAGNOSIS — G610 Guillain-Barre syndrome: Secondary | ICD-10-CM | POA: Diagnosis not present

## 2023-08-17 MED ORDER — LIDOCAINE 5 % EX PTCH
2.0000 | MEDICATED_PATCH | CUTANEOUS | Status: DC
  Administered 2023-08-17: 2 via TRANSDERMAL
  Filled 2023-08-17 (×2): qty 2

## 2023-08-17 NOTE — Discharge Instructions (Addendum)
Inpatient Rehab Discharge Instructions  FOLASADE MARENTETTE Discharge date and time: No discharge date for patient encounter.   Activities/Precautions/ Functional Status: Activity: no lifting, driving, or strenuous exercise until cleared by MD Diet: regular diet Wound Care: none needed Functional status:  ___ No restrictions     ___ Walk up steps independently ___ 24/7 supervision/assistance   ___ Walk up steps with assistance __x_ Intermittent supervision/assistance  ___ Bathe/dress independently ___ Walk with walker     ___ Bathe/dress with assistance ___ Walk Independently    ___ Shower independently ___ Walk with assistance    ___x Shower with assistance __x_ No alcohol     ___ Return to work/school ________  Special Instructions: No driving, alcohol consumption or tobacco use.    COMMUNITY REFERRALS UPON DISCHARGE:     Outpatient: PT   &   OT             Agency:[facility] [address] [place] [address] [phone]              Appointment Date/Time:WILL CALL TO SET UP FOLLOW UP APPOINTMENTS  Medical Equipment/Items Ordered:ROLLATOR                                                 Agency/Supplier:[facility]   [phone]    My questions have been answered and I understand these instructions. I will adhere to these goals and the provided educational materials after my discharge from the hospital.  Patient/Caregiver Signature _______________________________ Date __________  Clinician Signature _______________________________________ Date __________  Please bring this form and your medication list with you to all your follow-up doctor's appointments.

## 2023-08-17 NOTE — Progress Notes (Signed)
PROGRESS NOTE   Subjective/Complaints:  Pt reports some back pain- thinks its due to bed and lack of movement compared to normal.  LBM this AM.    ROS:   Pt denies SOB, abd pain, CP, N/V/C/D, and vision changes    Objective:   No results found. Recent Labs    08/16/23 0716  WBC 2.4*  HGB 10.4*  HCT 34.6*  PLT 207   Recent Labs    08/16/23 0716  NA 137  K 4.2  CL 105  CO2 28  GLUCOSE 95  BUN 14  CREATININE 0.79  CALCIUM 8.6*    Intake/Output Summary (Last 24 hours) at 08/17/2023 0925 Last data filed at 08/17/2023 0723 Gross per 24 hour  Intake 720 ml  Output --  Net 720 ml        Physical Exam: Vital Signs Blood pressure (!) 122/52, pulse 80, temperature 98 F (36.7 C), resp. rate 18, height 5\' 6"  (1.676 m), weight 71.2 kg, last menstrual period 10/19/2008, SpO2 96%.      General: awake, alert, appropriate, sitting up in bedside chair- curled up; NAD HENT: conjugate gaze; oropharynx moist CV: regular rate and rhythm; no JVD Pulmonary: CTA B/L; no W/R/R- good air movement GI: soft, NT, ND, (+)BS Psychiatric: appropriate- a little flat Neurological: Ox3  Musculoskeletal:     Cervical back: Neck supple. No tenderness.     Comments: Deltoids 4/5; Biceps 4+/5; Triceps 4/5; WE 5-/5; Grip 5-/5 and FA 4+/5 HF 4 to 4+/5; KE 4/5 KF 4+/5; DF 4+/5 and PF 5-/5 all B/L, exam stable 10/26 Skin:    General: Skin is warm and dry.     Comments: Scattered erythematous patches on back Has R wrist iv- looks OK  Neurological:     Mental Status: She is alert and oriented to person, place, and time.     Comments: Intact to light touch in all 4 extremities, stable 10/27 Ox3 Absent DTR  Assessment/Plan: 1. Functional deficits which require 3+ hours per day of interdisciplinary therapy in a comprehensive inpatient rehab setting. Physiatrist is providing close team supervision and 24 hour management of  active medical problems listed below. Physiatrist and rehab team continue to assess barriers to discharge/monitor patient progress toward functional and medical goals  Care Tool:  Bathing    Body parts bathed by patient: Right arm, Left arm, Chest, Abdomen, Right upper leg, Buttocks, Front perineal area, Left upper leg, Right lower leg, Left lower leg, Face         Bathing assist Assist Level: Supervision/Verbal cueing     Upper Body Dressing/Undressing Upper body dressing   What is the patient wearing?: Pull over shirt    Upper body assist Assist Level: Independent    Lower Body Dressing/Undressing Lower body dressing      What is the patient wearing?: Underwear/pull up, Pants     Lower body assist Assist for lower body dressing: Supervision/Verbal cueing     Toileting Toileting    Toileting assist Assist for toileting: Minimal Assistance - Patient > 75%     Transfers Chair/bed transfer  Transfers assist     Chair/bed transfer assist level: Minimal Assistance - Patient >  75%     Locomotion Ambulation   Ambulation assist      Assist level: Minimal Assistance - Patient > 75% Assistive device: Walker-rolling Max distance: 15 ft   Walk 10 feet activity   Assist     Assist level: Minimal Assistance - Patient > 75% Assistive device: Walker-rolling   Walk 50 feet activity   Assist Walk 50 feet with 2 turns activity did not occur: Safety/medical concerns         Walk 150 feet activity   Assist Walk 150 feet activity did not occur: Safety/medical concerns         Walk 10 feet on uneven surface  activity   Assist Walk 10 feet on uneven surfaces activity did not occur: Safety/medical concerns         Wheelchair     Assist Is the patient using a wheelchair?: Yes Type of Wheelchair: Manual    Wheelchair assist level: Supervision/Verbal cueing Max wheelchair distance: 150 ft    Wheelchair 50 feet with 2 turns  activity    Assist        Assist Level: Supervision/Verbal cueing   Wheelchair 150 feet activity     Assist      Assist Level: Supervision/Verbal cueing   Blood pressure (!) 122/52, pulse 80, temperature 98 F (36.7 C), resp. rate 18, height 5\' 6"  (1.676 m), weight 71.2 kg, last menstrual period 10/19/2008, SpO2 96%.   Medical Problem List and Plan: 1. 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  Antithrombotics: -DVT/anticoagulation:  Pharmaceutical: Lovenox             -antiplatelet therapy: Aspirin 81 mg   3. Pain Management: continue Tylenol, Robaxin, oxycodone as needed   10/28- said only taking Robaxin and tylenol- no sign on MAR that's taken Oxy since came to rehab  10/29- doing OK with meds- will give lidoderm  2 patches for low back fo rnow 4. Mood/Behavior/Sleep: LCSW to evaluate and provide emotional support             -continue Paxil              -antipsychotic agents: n/a   5. Neuropsych/cognition: This patient is capable of making decisions on her own behalf.   6. Skin/Wound Care: Routine skin care checks             -skin rash on back>>Sarna   7. Fluids/Electrolytes/Nutrition: Routine Is and Os and follow-up chemistries             -continue B12 supplementation   8: Hypertension: monitor TID and prn             -amlodipine decreased to 5mg  for hypotension             -continue losartan 50 mg daily  10: Hypokalemia: received 40 mEq today -recheck BMP in am   10/25- K_ up to 4.4 con't to monitor  11: Elevated BUN: encourage oral hydration and follow-up BMP in am- was drinking Coke - switched to water   10/25- BMP shows mild improvement in BUN- will con't to push fluids    12. Hypotension: amlodipine decreased to 5mg , resolved, continue this dose.  1-28- BP well controlled- running 110s to 130s with reduction of Norvasc.    I spent a total of  38  minutes on total care today- >50% coordination of care- due to  D/w pt about d/c plans- and team conference to determine length of stay      LOS: 5 days A FACE TO FACE EVALUATION WAS PERFORMED  Denzell Colasanti 08/17/2023, 9:25 AM

## 2023-08-17 NOTE — Progress Notes (Signed)
Physical Therapy Session Note  Patient Details  Name: Jill Allison MRN: [id number] Date of Birth: Apr 02, 1961  Today's Date: 08/17/2023 PT Individual Time: 1130-1200 PT Individual Time Calculation (min): 30 min   Short Term Goals: Week 1:  PT Short Term Goal 1 (Week 1): pt will ambulate x 30 ft with LRAD PT Short Term Goal 2 (Week 1): Pt will navigate steps with min A PT Short Term Goal 3 (Week 1): pt will tolerate >5 min of standing exercise  Skilled Therapeutic Interventions/Progress Updates:    Pt received in recliner and agreeable to therapy.  No complaint of pain.   Pt reports muscle soreness and fatigue, so opts to use rollator for start of session.  Pt ambulated > with and without AD with supervision, >250 ft.  Pt navigated stairs with supervision and BHR x 12. Reports incr soreness with eccentric motion.   Performed figure 8's around cone x 1 min and cone taps +lateral stepping for dynamic balance challenge, supervision to CGA with no LOB.   Pt then ambulated to [facility] tower and back with 1 seated rest break, no AD with supervision. Demoes good safety awareness. Returned to recliner and was left with all needs in reach and alarm active.   Therapy Documentation Precautions:  Precautions Precautions: Fall Precaution Comments: ataxia Restrictions Weight Bearing Restrictions: No General:       Therapy/Group: Individual Therapy  Juluis Rainier 08/17/2023, 12:10 PM

## 2023-08-17 NOTE — Progress Notes (Addendum)
Patient ID: Jill Allison, female   DOB: 1961-04-02, 62 y.o.   MRN: [id number]  Met with pt and husband to give team conference update regarding goals of mod/I-supervision level and discharge date 10/31. Team recommends a rollator and OPPT ans OT. Both report [facility] is closer to them and want worker to make referral to this OP facility. Aware PA has FMLA forms. Will continue to work on discharge needs.   3;32 PM Referral made to [facility] for rollator and [facility] for PT and OT.

## 2023-08-17 NOTE — Progress Notes (Signed)
Occupational Therapy Session Note  Patient Details  Name: Jill Allison MRN: [id number] Date of Birth: 1960-11-12  Today's Date: 08/17/2023 OT Individual Time: 1030-1055 OT Individual Time Calculation (min): 25 min    Short Term Goals: Week 1:  OT Short Term Goal 1 (Week 1): Patient to perform toileting with SBA OT Short Term Goal 2 (Week 1): Patient to perform LE dressing with set up assist OT Short Term Goal 3 (Week 1): Patient to perform grooming standing at the sink with close SBA OT Short Term Goal 4 (Week 1): Patient to perform bathing with close SBA  Skilled Therapeutic Interventions/Progress Updates:    Pt resting in recliner upon arrival. Amb without AD to day room. Squats and sit<>stand from EOB without BUE support-3x10 each for strengthening and general conditioning. Pt amb without AD back to room. Pt remained in relciner with seat alarm activated. All needs within reach.   Therapy Documentation Precautions:  Precautions Precautions: Fall Precaution Comments: ataxia Restrictions Weight Bearing Restrictions: No Pain: Pt c/o 4/10 back pain; repositioning and stretching   Therapy/Group: Individual Therapy  Rich Brave 08/17/2023, 2:50 PM

## 2023-08-17 NOTE — Patient Care Conference (Signed)
Inpatient RehabilitationTeam Conference and Plan of Care Update Date: 08/17/2023   Time: 11:02 AM    Patient Name: Jill Allison      Medical Record Number: [id number]  Date of Birth: 1961-06-30 Sex: Female         Room/Bed: 4M10C/4M10C-01 Payor Info: Payor: Advertising copywriter / Plan: Advertising copywriter OTHER / Product Type: *No Product type* /    Admit Date/Time:  08/12/2023  5:38 PM  Primary Diagnosis:  GBS (Guillain-Barre syndrome) [facility])  Hospital Problems: Principal Problem:   GBS (Guillain-Barre syndrome) [facility])    Expected Discharge Date: Expected Discharge Date: 08/19/23  Team Members Present: Physician leading conference: Dr. Genice Rouge Social Worker Present: Dossie Der, LCSW Nurse Present: Vedia Pereyra, RN PT Present: Bernie Covey, PT OT Present: Roney Mans, OT;Ardis Rowan, COTA PPS Coordinator present : Fae Pippin, SLP     Current Status/Progress Goal Weekly Team Focus  Bowel/Bladder   Pt is continent of bowel/bladder   Pt will remain continent of bowel/bladder   Will assess qshift and PRN    Swallow/Nutrition/ Hydration               ADL's   supervision overall   mod I overall, supervision tub/shower transfers   endurance, education, safety awareness    Mobility   CGA-supervision, rollator for community (>500 ft) but supervision-CGA gait with no AD for shorter distances   mod I-supervision  endurance, strength, d/c planning    Communication                Safety/Cognition/ Behavioral Observations               Pain   Pt denies pain   Pt will continue to deny pain   Will assess qshift and PRN    Skin   Pt's skin is intact   Pt's skin will remain intact  Will assess qshift and PRN      Discharge Planning:  Home with husband who will take FMLA-forms with PA. Will need recommendations regarding follow up and DME. Doing well and progressing quickly   Team Discussion: GBS. Pain managed with PRN medications. Skin  intact.  Watching blood pressure. Tolerating regular diet.  Patient on target to meet rehab goals: yes, on target to meet goals. Discharge date 08/19/23  *See Care Plan and progress notes for long and short-term goals.   Revisions to Treatment Plan:  Lidoderm patch added at HS.  Monitor labs/VS Teaching Needs: Medications, safety, self care, gait/transfer training, etc.   Current Barriers to Discharge: Decreased caregiver support  Possible Resolutions to Barriers: Family education Order recommended DME if needed     Medical Summary Current Status: some back pain with GBS/[employer]- LBM 10/28- conitnent B/B- skin intact-  Barriers to Discharge: Behavior/Mood;Other (comments)  Barriers to Discharge Comments: needs FMLA forms for husband to care for her- at CGA-supervison- Possible Resolutions to [employer] Focus: uses rolator to bring her own seat- lidocaien patches for back pain- 10/31-   Continued Need for Acute Rehabilitation Level of Care: The patient requires daily medical management by a physician with specialized training in physical medicine and rehabilitation for the following reasons: Direction of a multidisciplinary physical rehabilitation program to maximize functional independence : Yes Medical management of patient stability for increased activity during participation in an intensive rehabilitation regime.: Yes Analysis of laboratory values and/or radiology reports with any subsequent need for medication adjustment and/or medical intervention. : Yes   I attest that I was present, lead  the team conference, and concur with the assessment and plan of the team.   Jearld Adjutant 08/17/2023, 3:11 PM

## 2023-08-17 NOTE — Progress Notes (Signed)
Physical Therapy Session Note  Patient Details  Name: Jill Allison MRN: [id number] Date of Birth: 11/29/1960  Today's Date: 08/17/2023 PT Individual Time: 1417-1530 PT Individual Time Calculation (min): 73 min   Short Term Goals: Week 1:  PT Short Term Goal 1 (Week 1): pt will ambulate x 30 ft with LRAD PT Short Term Goal 2 (Week 1): Pt will navigate steps with min A PT Short Term Goal 3 (Week 1): pt will tolerate >5 min of standing exercise  Skilled Therapeutic Interventions/Progress Updates: Pt presents sitting in recliner and agreeable to therapy.  Pt transfers sit to stand from various surfaces and supervision throughout session.  Pt amb to rollator and then out of room and to [facility] entrances w/o rest, supervision.  Pt then amb outdoor surfaces w/ supervision and occasional CGA on gait belt 2/2 amb on grass, mulch for safety only.  Pt has no LOB, shuffles throughout session.  Pt amb on ramped surface, negotiating through car barriers, quick stops/starts.  Pt amb holding yellow T-ball to each side and front as well as cylinder bolster in each hand.  Pt returned to room and sitting in recliner w/ seat alarm on and all needs in reach.     Therapy Documentation Precautions:  Precautions Precautions: Fall Precaution Comments: ataxia Restrictions Weight Bearing Restrictions: No General:   Vital Signs: Therapy Vitals Temp: 98.2 F (36.8 C) Pulse Rate: 91 Resp: 18 BP: 112/64 Patient Position (if appropriate): Sitting Oxygen Therapy SpO2: 98 % O2 Device: Room Air Pain:0/10, sore quads from yesterday.     Therapy/Group: Individual Therapy  Lucio Edward 08/17/2023, 3:57 PM

## 2023-08-17 NOTE — Progress Notes (Signed)
Occupational Therapy Session Note  Patient Details  Name: Jill Allison MRN: [id number] Date of Birth: May 23, 1961  Today's Date: 08/17/2023 OT Individual Time: 8295-6213 OT Individual Time Calculation (min): 70 min    Short Term Goals: Week 1:  OT Short Term Goal 1 (Week 1): Patient to perform toileting with SBA OT Short Term Goal 2 (Week 1): Patient to perform LE dressing with set up assist OT Short Term Goal 3 (Week 1): Patient to perform grooming standing at the sink with close SBA OT Short Term Goal 4 (Week 1): Patient to perform bathing with close SBA  Skilled Therapeutic Interventions/Progress Updates:    Initial focus on bathing/dressing. Pt amb in room to gather supplies/clothing before entering bathroom. Bathing/dressing with sit<>stand at supervision level. Amb with RW to day room and the general gym. Strengthening and balance activities included: zoom ball, tidal wave, and BodyBlade on compliant and non compliant surfaces. No LOB noted. Pt returned to room. Pt remained in recliner with all needs within reach. Seat alarm activated.   Therapy Documentation Precautions:  Precautions Precautions: Fall Precaution Comments: ataxia Restrictions Weight Bearing Restrictions: No   Pain:  Pt reports 4/10 back pain; shower and activity   Therapy/Group: Individual Therapy  Rich Brave 08/17/2023, 9:27 AM

## 2023-08-18 ENCOUNTER — Other Ambulatory Visit (HOSPITAL_COMMUNITY): Payer: Self-pay

## 2023-08-18 DIAGNOSIS — G610 Guillain-Barre syndrome: Secondary | ICD-10-CM | POA: Diagnosis not present

## 2023-08-18 LAB — MUSK ANTIBODIES: MuSK Antibodies: <1 U/mL

## 2023-08-18 MED ORDER — LOSARTAN POTASSIUM 50 MG PO TABS
50.0000 mg | ORAL_TABLET | Freq: Every day | ORAL | 0 refills | Status: AC
  Filled 2023-08-18 – 2023-08-19 (×2): qty 30, 30d supply, fill #0

## 2023-08-18 MED ORDER — ACETAMINOPHEN 325 MG PO TABS: 325.0000 mg | ORAL_TABLET | ORAL | Status: AC | PRN

## 2023-08-18 MED ORDER — BISACODYL 5 MG PO TBEC: 5.0000 mg | DELAYED_RELEASE_TABLET | Freq: Every day | ORAL | Status: DC | PRN

## 2023-08-18 MED ORDER — LIDOCAINE 5 % EX PTCH
2.0000 | MEDICATED_PATCH | CUTANEOUS | 1 refills | Status: DC
  Filled 2023-08-18: qty 15, 7d supply, fill #0

## 2023-08-18 MED ORDER — CYANOCOBALAMIN 1000 MCG PO TABS
1000.0000 ug | ORAL_TABLET | Freq: Every day | ORAL | 0 refills | Status: AC
  Filled 2023-08-18 – 2023-08-19 (×2): qty 30, 30d supply, fill #0

## 2023-08-18 MED ORDER — AMLODIPINE BESYLATE 5 MG PO TABS
5.0000 mg | ORAL_TABLET | Freq: Every day | ORAL | 0 refills | Status: DC
  Filled 2023-08-18 – 2023-08-19 (×2): qty 30, 30d supply, fill #0

## 2023-08-18 MED ORDER — DOCUSATE SODIUM 100 MG PO CAPS: 100.0000 mg | ORAL_CAPSULE | Freq: Every day | ORAL | Status: AC

## 2023-08-18 MED ORDER — CAMPHOR-MENTHOL 0.5-0.5 % EX LOTN: TOPICAL_LOTION | CUTANEOUS | Status: DC | PRN

## 2023-08-18 MED ORDER — METHOCARBAMOL 500 MG PO TABS
500.0000 mg | ORAL_TABLET | Freq: Four times a day (QID) | ORAL | 1 refills | Status: DC | PRN
  Filled 2023-08-18 – 2023-08-19 (×2): qty 30, 8d supply, fill #0

## 2023-08-18 NOTE — Progress Notes (Signed)
PROGRESS NOTE   Subjective/Complaints:  Pt reports some back pain still but somewhat better with lidocaine patches- last night- helpful.  LBM yesterday No other issues Happy about d/c tomorrow  ROS:   Pt denies SOB, abd pain, CP, N/V/C/D, and vision changes     Objective:   No results found. Recent Labs    08/16/23 0716  WBC 2.4*  HGB 10.4*  HCT 34.6*  PLT 207   Recent Labs    08/16/23 0716  NA 137  K 4.2  CL 105  CO2 28  GLUCOSE 95  BUN 14  CREATININE 0.79  CALCIUM 8.6*    Intake/Output Summary (Last 24 hours) at 08/18/2023 0836 Last data filed at 08/18/2023 2956 Gross per 24 hour  Intake 600 ml  Output --  Net 600 ml        Physical Exam: Vital Signs Blood pressure (!) 111/52, pulse 78, temperature 97.9 F (36.6 C), temperature source Oral, resp. rate 18, height 5\' 6"  (1.676 m), weight 71.2 kg, last menstrual period 10/19/2008, SpO2 98%.      General: awake, alert, appropriate, sitting u in bedside chair; NAD HENT: conjugate gaze; oropharynx moist CV: regular rate and rhythm; no JVD Pulmonary: CTA B/L; no W/R/R- good air movement GI: soft, NT, ND, (+)BS Psychiatric: appropriate- interactive Neurological: Ox3  Musculoskeletal:     Cervical back: Neck supple. No tenderness.     Comments: Deltoids 4/5; Biceps 4+/5; Triceps 4/5; WE 5-/5; Grip 5-/5 and FA 4+/5 HF 4 to 4+/5; KE 4/5 KF 4+/5; DF 4+/5 and PF 5-/5 all B/L, exam stable 10/26 Skin:    General: Skin is warm and dry.     Comments: Scattered erythematous patches on back Has R wrist iv- looks OK  Neurological:     Mental Status: She is alert and oriented to person, place, and time.     Comments: Intact to light touch in all 4 extremities, stable 10/27 Ox3 Absent DTR  Assessment/Plan: 1. Functional deficits which require 3+ hours per day of interdisciplinary therapy in a comprehensive inpatient rehab setting. Physiatrist is  providing close team supervision and 24 hour management of active medical problems listed below. Physiatrist and rehab team continue to assess barriers to discharge/monitor patient progress toward functional and medical goals  Care Tool:  Bathing    Body parts bathed by patient: Right arm, Left arm, Chest, Abdomen, Right upper leg, Buttocks, Front perineal area, Left upper leg, Right lower leg, Left lower leg, Face         Bathing assist Assist Level: Independent with assistive device     Upper Body Dressing/Undressing Upper body dressing   What is the patient wearing?: Pull over shirt    Upper body assist Assist Level: Independent    Lower Body Dressing/Undressing Lower body dressing      What is the patient wearing?: Underwear/pull up, Pants     Lower body assist Assist for lower body dressing: Independent     Toileting Toileting    Toileting assist Assist for toileting: Independent with assistive device     Transfers Chair/bed transfer  Transfers assist     Chair/bed transfer assist level: Minimal Assistance -  Patient > 75%     Locomotion Ambulation   Ambulation assist      Assist level: Supervision/Verbal cueing Assistive device: Rollator (and w/o AD) Max distance: >200'   Walk 10 feet activity   Assist     Assist level: Supervision/Verbal cueing Assistive device: Rollator (and w/o AD)   Walk 50 feet activity   Assist Walk 50 feet with 2 turns activity did not occur: Safety/medical concerns  Assist level: Supervision/Verbal cueing Assistive device: Rollator, Other (comment) (and w/o AD)    Walk 150 feet activity   Assist Walk 150 feet activity did not occur: Safety/medical concerns  Assist level: Supervision/Verbal cueing Assistive device: Rollator, Other (comment) (and w/o AD)    Walk 10 feet on uneven surface  activity   Assist Walk 10 feet on uneven surfaces activity did not occur: Safety/medical concerns   Assist  level: Supervision/Verbal cueing Assistive device: Rollator, Other (comment) (and w/o AD)   Wheelchair     Assist Is the patient using a wheelchair?: Yes Type of Wheelchair: Manual    Wheelchair assist level: Supervision/Verbal cueing Max wheelchair distance: 150 ft    Wheelchair 50 feet with 2 turns activity    Assist        Assist Level: Supervision/Verbal cueing   Wheelchair 150 feet activity     Assist      Assist Level: Supervision/Verbal cueing   Blood pressure (!) 111/52, pulse 78, temperature 97.9 F (36.6 C), temperature source Oral, resp. rate 18, height 5\' 6"  (1.676 m), weight 71.2 kg, last menstrual period 10/19/2008, SpO2 98%.   Medical Problem List and Plan: 1. 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  Antithrombotics: -DVT/anticoagulation:  Pharmaceutical: Lovenox             -antiplatelet therapy: Aspirin 81 mg   3. Pain Management: continue Tylenol, Robaxin, oxycodone as needed   10/28- said only taking Robaxin and tylenol- no sign on MAR that's taken Oxy since came to rehab  10/29- doing OK with meds- will give lidoderm  2 patches for low back fo rnow  10/30- helped somewhat with lidoderm- con't patches 4. Mood/Behavior/Sleep: LCSW to evaluate and provide emotional support             -continue Paxil              -antipsychotic agents: n/a   5. Neuropsych/cognition: This patient is capable of making decisions on her own behalf.   6. Skin/Wound Care: Routine skin care checks             -skin rash on back>>Sarna   7. Fluids/Electrolytes/Nutrition: Routine Is and Os and follow-up chemistries             -continue B12 supplementation   8: Hypertension: monitor TID and prn             -amlodipine decreased to 5mg  for hypotension             -continue losartan 50 mg daily  10: Hypokalemia: received 40 mEq today -recheck  BMP in am   10/25- K_ up to 4.4 con't to monitor  11: Elevated BUN: encourage oral hydration and follow-up BMP in am- was drinking Coke - switched to water   10/25- BMP shows mild improvement in  BUN- will con't to push fluids   10/30- says is drinking- BUN down to 14 12. Hypotension: amlodipine decreased to 5mg , resolved, continue this dose.    1-28- BP well controlled- running 110s to 130s with reduction of Norvasc.      LOS: 6 days A FACE TO FACE EVALUATION WAS PERFORMED  Deionte Spivack 08/18/2023, 8:36 AM

## 2023-08-18 NOTE — Plan of Care (Signed)
  Problem: RH Balance ?Goal: LTG Patient will maintain dynamic standing with ADLs (OT) ?Description: LTG:  Patient will maintain dynamic standing balance with assist during activities of daily living (OT)  ?Outcome: Completed/Met ?  ?

## 2023-08-18 NOTE — Progress Notes (Signed)
Occupational Therapy Session Note  Patient Details  Name: Jill Allison MRN: [id number] Date of Birth: 06-Sep-1961  Today's Date: 08/18/2023 OT Individual Time: 4098-1191 OT Individual Time Calculation (min): 70 min    Short Term Goals: Week 1:  OT Short Term Goal 1 (Week 1): Patient to perform toileting with SBA OT Short Term Goal 2 (Week 1): Patient to perform LE dressing with set up assist OT Short Term Goal 3 (Week 1): Patient to perform grooming standing at the sink with close SBA OT Short Term Goal 4 (Week 1): Patient to perform bathing with close SBA  Skilled Therapeutic Interventions/Progress Updates:    Pt amb in room without AD to gather supplies before entering bathroom to use toilet and bathe/dress. Pt completed all tasks at mod I. No LOB noted. Pt amb to day room. NuStep 7 mins level 5 and 5 mins level 3 no UE. Pt amb to gym for sit<>stand to toss ball against bounce back. Pt amb to ortho gym for SciFit 7 mins random level 4. Pt returned to room and rested in relciner. Seat alarm activated. All needs within reach. Pt to be made mod I in room later in day.  Therapy Documentation Precautions:  Precautions Precautions: Fall Precaution Comments: ataxia Restrictions Weight Bearing Restrictions: No   Pain:  Pt c/o BLE soreness from previous day therapy; rest and shower ADL: ADL Eating: Independent Where Assessed-Eating: Chair Grooming: Independent Where Assessed-Grooming: Standing at sink Upper Body Bathing: Modified independent Where Assessed-Upper Body Bathing: Shower Lower Body Bathing: Modified independent Where Assessed-Lower Body Bathing: Shower Upper Body Dressing: Independent Where Assessed-Upper Body Dressing: Chair Lower Body Dressing: Modified independent Where Assessed-Lower Body Dressing: Standing at sink, Chair Toileting: Modified independent Where Assessed-Toileting: Teacher, adult education: Engineer, agricultural Method:  Proofreader: Engineer, technical sales: Not assessed Web designer Method: Engineer, technical sales: Emergency planning/management officer, Grab bars, Walk in Electrical engineer Transfer: Modified independent Film/video editor Method: Designer, industrial/product: Emergency planning/management officer, Grab bars  Therapy/Group: Individual Therapy  Rich Brave 08/18/2023, 9:32 AM

## 2023-08-18 NOTE — Progress Notes (Signed)
Occupational Therapy Discharge Summary  Patient Details  Name: Jill Allison MRN: [id number] Date of Birth: [birth year]  Date of Discharge from OT service:August 18, 2023   Patient has met 11 of 11 long term goals due to improved activity tolerance, improved balance, postural control, ability to compensate for deficits, and improved coordination.  Pt made excellent progress with BADLs and IADLs during this admission. Mod I for bathing/dressing and functional transfers. Supervision/mod I for IADLs. Pt's family has not been present for educaiton. Patient to discharge at overall Modified Independent level.  Patient's care partner is independent to provide the necessary physical assistance at discharge.    Reasons goals not met: n/a  Recommendation:  Patient will benefit from ongoing skilled OT services in outpatient setting to continue to advance functional skills in the area of BADL, iADL, Vocation, and Reduce care partner burden.  Equipment: No equipment provided  Reasons for discharge: treatment goals met  Patient/family agrees with progress made and goals achieved: Yes  OT Discharge ADL ADL Eating: Independent Where Assessed-Eating: Chair Grooming: Independent Where Assessed-Grooming: Standing at sink Upper Body Bathing: Modified independent Where Assessed-Upper Body Bathing: Shower Lower Body Bathing: Modified independent Where Assessed-Lower Body Bathing: Shower Upper Body Dressing: Independent Where Assessed-Upper Body Dressing: Chair Lower Body Dressing: Modified independent Where Assessed-Lower Body Dressing: Standing at sink, Chair Toileting: Modified independent Where Assessed-Toileting: Teacher, adult education: Engineer, agricultural Method: Proofreader: Engineer, technical sales: Not assessed Web designer Method: Engineer, technical sales: Emergency planning/management officer, Grab bars, Walk in Research officer, trade union: Modified independent Film/video editor Method: Designer, industrial/product: Emergency planning/management officer, Grab bars Vision Baseline Vision/History: 1 Wears glasses Patient Visual Report: No change from baseline Vision Assessment?: Wears glasses for reading Perception  Perception: Within Functional Limits Praxis Praxis: WFL Cognition Cognition Overall Cognitive Status: Within Functional Limits for tasks assessed Arousal/Alertness: Awake/alert Orientation Level: Person;Place;Situation Person: Oriented Place: Oriented Situation: Oriented Memory: Appears intact Attention: Selective Focused Attention: Appears intact Selective Attention: Appears intact Awareness: Appears intact Problem Solving: Appears intact Safety/Judgment: Appears intact Brief Interview for Mental Status (BIMS) Repetition of Three Words (First Attempt): 3 Temporal Orientation: Year: Correct Temporal Orientation: Month: Accurate within 5 days Temporal Orientation: Day: Correct Recall: "Sock": Yes, no cue required Recall: "Blue": Yes, no cue required Recall: "Bed": Yes, no cue required BIMS Summary Score: 15 Sensation Sensation Light Touch: Impaired Detail Peripheral sensation comments: minimal numbness in BUE; improving Light Touch Impaired Details: Impaired RUE;Impaired LUE Hot/Cold: Appears Intact Proprioception: Appears Intact Stereognosis: Appears Intact Additional Comments: mild pins and needles in fingers, reports numbness, but doesnt show deficits during FM manipulation Coordination Gross Motor Movements are Fluid and Coordinated: Yes Fine Motor Movements are Fluid and Coordinated: Yes Coordination and Movement Description: mild tremor that increases with fatigue, ataxia Motor  Motor Motor: Ataxia;Motor impersistence Mobility     Trunk/Postural Assessment  Cervical Assessment Cervical Assessment: Within Functional Limits Thoracic Assessment Thoracic Assessment: Within  Functional Limits Lumbar Assessment Lumbar Assessment: Within Functional Limits Postural Control Postural Control: Within Functional Limits  Balance Static Sitting Balance Static Sitting - Balance Support: Feet supported Static Sitting - Level of Assistance: 7: Independent Dynamic Sitting Balance Dynamic Sitting - Balance Support: During functional activity Dynamic Sitting - Level of Assistance: 6: Modified independent (Device/Increase time) Extremity/Trunk Assessment RUE Assessment RUE Assessment: Within Functional Limits     Rich Jill 08/18/2023, 11:11 AM

## 2023-08-18 NOTE — Progress Notes (Signed)
Physical Therapy Session Note  Patient Details  Name: Jill Allison MRN: [id number] Date of Birth: April 24, 1961  Today's Date: 08/18/2023 PT Individual Time: 1123-1205 PT Individual Time Calculation (min): 42 min   Short Term Goals: Week 1:  PT Short Term Goal 1 (Week 1): pt will ambulate x 30 ft with LRAD PT Short Term Goal 2 (Week 1): Pt will navigate steps with min A PT Short Term Goal 3 (Week 1): pt will tolerate >5 min of standing exercise  Skilled Therapeutic Interventions/Progress Updates: Pt presented in recliner agreeable to therapy. Pt denies pain however states bed has been making her back a little sore. Session focused on functional mobility in preparation for d/c. Pt ambulated to ortho gym then to ADL apt due to crowding. Pt participated in bed mobility and furniture transfers at independent level. Pt then ambulated to main gym and ascended/descended stairs x 12 mod I with step through pattern. Pt also participated in FGA with improved score of 29/30. Pt then ambulated to ortho gym and demosntrated car transfer at SUV height at mod I level. Based on discussion with COTA pt returned to room and advised nsg that pt now at mod I status. Pt returned to room at end of session and returned to recliner. Pt left with call bell within reach and needs met.      Therapy Documentation Precautions:  Precautions Precautions: Fall Precaution Comments: ataxia Restrictions Weight Bearing Restrictions: No General:   Vital Signs: Therapy Vitals Temp: 98.1 F (36.7 C) Temp Source: Oral Pulse Rate: 83 Resp: 18 BP: (!) 107/56 Patient Position (if appropriate): Sitting Oxygen Therapy SpO2: 99 % O2 Device: Room Air   Therapy/Group: Individual Therapy  Johnathin Vanderschaaf 08/18/2023, 4:23 PM

## 2023-08-18 NOTE — Progress Notes (Signed)
Physical Therapy Discharge Summary  Patient Details  Name: Jill Allison MRN: [id number] Date of Birth: April 05, 1961  Date of Discharge from PT service:August 18, 2023  Today's Date: 08/18/2023 PT Individual Time:  -       Patient has met {NUMBERS 0-12:18577} of {NUMBERS 0-12:18577} long term goals due to {due MW:1027253}.  Patient to discharge at [facility] level {LOA:3049010}.   Patient's care partner {care partner:3041650} to provide the necessary {assistance:3041652} assistance at discharge.  Reasons goals not met: ***  Recommendation:  Patient will benefit from ongoing skilled PT services in {setting:3041680} to continue to advance safe functional mobility, address ongoing impairments in ***, and minimize fall risk.  Equipment: {equipment:3041657}  Reasons for discharge: {Reason for discharge:3049018}  Patient/family agrees with progress made and goals achieved: {Pt/Family agree with progress/goals:3049020}  PT Discharge Precautions/Restrictions Precautions Precautions: Fall Precaution Comments: ataxia Restrictions Weight Bearing Restrictions: No Vital Signs Therapy Vitals Temp: 98.5 F (36.9 C) Temp Source: Tympanic Pulse Rate: 92 Resp: 16 BP: 115/76 Patient Position (if appropriate): Sitting Oxygen Therapy SpO2: 97 % O2 Device: Room Air Pain Pain Assessment Pain Scale: 0-10 Pain Score: 0-No pain Faces Pain Scale: No hurt Pain Location: Back Pain Interference Pain Interference Pain Effect on Sleep: 2. Occasionally Pain Interference with Therapy Activities: 1. Rarely or not at all Pain Interference with Day-to-Day Activities: 2. Occasionally Vision/Perception  Vision - History Ability to See in Adequate Light: 0 Adequate Perception Perception: Within Functional Limits Praxis Praxis: WFL  Cognition Overall Cognitive Status: Within Functional Limits for tasks assessed Arousal/Alertness: Awake/alert Orientation Level: Oriented X4 Month:  October Attention: Selective Focused Attention: Appears intact Selective Attention: Appears intact Memory: Appears intact Awareness: Appears intact Problem Solving: Appears intact Safety/Judgment: Appears intact Sensation Sensation Light Touch: Impaired Detail Peripheral sensation comments: minimal numbness in BUE; improving Light Touch Impaired Details: Impaired RUE;Impaired LUE Hot/Cold: Appears Intact Proprioception: Appears Intact Stereognosis: Appears Intact Additional Comments: Pt stating initially pins and needles in B feet and hands when arrived, per pt now absent in feet but present at fingertips Coordination Gross Motor Movements are Fluid and Coordinated: Yes Fine Motor Movements are Fluid and Coordinated: Yes Coordination and Movement Description: mild tremor that increases with fatigue, ataxia Motor  Motor Motor: Ataxia;Motor impersistence  Mobility Bed Mobility Bed Mobility: Supine to Sit Supine to Sit: Independent Transfers Transfers: Sit to Stand;Stand Pivot Transfers Sit to Stand: Independent Stand Pivot Transfers: Independent Transfer (Assistive device): None Locomotion  Gait Ambulation: Yes Gait Assistance: Independent Gait Distance (Feet): 150 Feet Assistive device: None Gait Gait: Yes Gait Pattern: Ataxic;Step-through pattern Stairs / Additional Locomotion Stairs: Yes Stairs Assistance: Independent with assistive device Stair Management Technique: Two rails Number of Stairs: 12 Height of Stairs: 6 Wheelchair Mobility Wheelchair Mobility: No  Trunk/Postural Assessment  Cervical Assessment Cervical Assessment: Within Functional Limits Thoracic Assessment Thoracic Assessment: Within Functional Limits Lumbar Assessment Lumbar Assessment: Within Functional Limits Postural Control Postural Control: Within Functional Limits  Balance Balance Balance Assessed: Yes Static Sitting Balance Static Sitting - Balance Support: Feet supported Static  Sitting - Level of Assistance: 7: Independent Dynamic Sitting Balance Dynamic Sitting - Balance Support: During functional activity Dynamic Sitting - Level of Assistance: 6: Modified independent (Device/Increase time) Static Standing Balance Static Standing - Balance Support: During functional activity Static Standing - Level of Assistance: 7: Independent Dynamic Standing Balance Dynamic Standing - Balance Support: During functional activity Dynamic Standing - Level of Assistance: 7: Independent Functional Gait  Assessment Gait assessed : Yes Gait Level Surface: Walks 20  ft in less than 5.5 sec, no assistive devices, good speed, no evidence for imbalance, normal gait pattern, deviates no more than 6 in outside of the 12 in walkway width. Change in Gait Speed: Able to smoothly change walking speed without loss of balance or gait deviation. Deviate no more than 6 in outside of the 12 in walkway width. Gait with Horizontal Head Turns: Performs head turns smoothly with slight change in gait velocity (eg, minor disruption to smooth gait path), deviates 6-10 in outside 12 in walkway width, or uses an assistive device. Gait with Vertical Head Turns: Performs head turns with no change in gait. Deviates no more than 6 in outside 12 in walkway width. Gait and Pivot Turn: Pivot turns safely within 3 sec and stops quickly with no loss of balance. Step Over Obstacle: Is able to step over 2 stacked shoe boxes taped together (9 in total height) without changing gait speed. No evidence of imbalance. Gait with Narrow Base of Support: Is able to ambulate for 10 steps heel to toe with no staggering. Gait with Eyes Closed: Walks 20 ft, no assistive devices, good speed, no evidence of imbalance, normal gait pattern, deviates no more than 6 in outside 12 in walkway width. Ambulates 20 ft in less than 7 sec. Ambulating Backwards: Walks 20 ft, no assistive devices, good speed, no evidence for imbalance, normal  gait Steps: Alternating feet, no rail. Total Score: 29 Extremity Assessment  RUE Assessment RUE Assessment: Within Functional Limits LUE Assessment LUE Assessment: Within Functional Limits RLE Assessment RLE Assessment: Within Functional Limits LLE Assessment LLE Assessment: Within Functional Limits   Rosita DeChalus 08/18/2023, 12:06 PM

## 2023-08-18 NOTE — Progress Notes (Signed)
Occupational Therapy Session Note  Patient Details  Name: HELYNE PESCHEL MRN: [id number] Date of Birth: February 13, 1961  Today's Date: 08/18/2023 OT Individual Time: 9528-4132 OT Individual Time Calculation (min): 25 min    Short Term Goals: Week 1:  OT Short Term Goal 1 (Week 1): Patient to perform toileting with SBA OT Short Term Goal 2 (Week 1): Patient to perform LE dressing with set up assist OT Short Term Goal 3 (Week 1): Patient to perform grooming standing at the sink with close SBA OT Short Term Goal 4 (Week 1): Patient to perform bathing with close SBA  Skilled Therapeutic Interventions/Progress Updates:   Pt seen for skilled OT session with focus on discharge education and training on higher level balance and HEP. Family present for session and pt mod I now in room. Pt educated on use of reacher for hard to access items and to avoid bending for falls prevention as pt continues to report some proximal overall general weakness from prolonged bed rest and current condition. Pt to acquire reacher online. OT educated on use of red tband in standing for tricep press 10 reps x 3 sets. Pt able to teach back indep. Pt instructed in BORG scale for RPE with pt able to qualify certain activities. Pt with all goals met with no further needs at this time.   Pain: denies pain just back tightness and discomfort relieved with repositioning and rest    Therapy Documentation Precautions:  Precautions Precautions: Fall Precaution Comments: ataxia Restrictions Weight Bearing Restrictions: No Therapy/Group: Individual Therapy  Vicenta Dunning 08/18/2023, 7:43 AM

## 2023-08-18 NOTE — Progress Notes (Signed)
Physical Therapy Session Note  Patient Details  Name: Jill Allison MRN: [id number] Date of Birth: 1960/12/22  Today's Date: 08/18/2023 PT Individual Time: 3474-2595 PT Individual Time Calculation (min): 40 min   Short Term Goals: Week 1:  PT Short Term Goal 1 (Week 1): pt will ambulate x 30 ft with LRAD PT Short Term Goal 2 (Week 1): Pt will navigate steps with min A PT Short Term Goal 3 (Week 1): pt will tolerate >5 min of standing exercise  Skilled Therapeutic Interventions/Progress Updates:     Pt received seated in recliner and agrees to therapy. Reports soreness in both legs in quads. Number not provided. PT provides rest breaks and mobility as needed to manage pain. Pt performs all mobility independently during session. Pt ambulates >1000', including navigating elevator and over unlevel and varying surfaces outside. Pt takes seated rest break prior to additional ambulation.  Following, pt ambulates additional bout, including up and down x12 steps with right hand rail, and across streets with cues to utilize crosswalks and for safety. Pt left seated in recliner.   Therapy Documentation Precautions:  Precautions Precautions: Fall Precaution Comments: ataxia Restrictions Weight Bearing Restrictions: No   Therapy/Group: Individual Therapy  Beau Fanny, PT, DPT 08/18/2023, 4:57 PM

## 2023-08-19 ENCOUNTER — Other Ambulatory Visit (HOSPITAL_COMMUNITY): Payer: Self-pay

## 2023-08-19 DIAGNOSIS — G610 Guillain-Barre syndrome: Secondary | ICD-10-CM | POA: Diagnosis not present

## 2023-08-19 NOTE — Progress Notes (Signed)
Inpatient Rehabilitation Care Coordinator Discharge Note   Patient Details  Name: Jill Allison MRN: [id number] Date of Birth: 06-04-1961   Discharge location: HOME WITH HUSBAND WHO IS TAKING A FMLA TO BE THERE WITH HER-24/7  Length of Stay: 7 DAYS  Discharge activity level: SUPERVISION LEVEL  Home/community participation: ACTIVE  Patient response RK:YHCWCB Literacy - How often do you need to have someone help you when you read instructions, pamphlets, or other written material from your doctor or pharmacy?: Never  Patient response JS:EGBTDV Isolation - How often do you feel lonely or isolated from those around you?: Rarely  Services provided included: MD, RD, PT, OT, RN, CM, TR, Pharmacy, Neuropsych, SW  Financial Services:  Field seismologist Utilized: [employer]  Choices offered to/list presented to: PT AND HUSBAND  Follow-up services arranged:  Outpatient, DME, Patient/Family has no preference for HH/DME agencies    Outpatient Servicies: [facility] WILL CALL TO SET UP FOLLOW UP APPOINTMENTS DME : [facility]    Patient response to transportation need: Is the patient able to respond to transportation needs?: Yes In the past 12 months, has lack of transportation kept you from medical appointments or from getting medications?: No In the past 12 months, has lack of transportation kept you from meetings, work, or from getting things needed for daily living?: No   Patient/Family verbalized understanding of follow-up arrangements:  Yes  Individual responsible for coordination of the follow-up plan: SELF ANF JEFF-HUSBAND [phone]  Confirmed correct DME delivered: Lucy Chris 08/19/2023    Comments (or additional information):PT DID WELL AND HUSBAND WAS HERE TO SEE IN THERAPIES. FEELS READY TO GO HOME TODAY  Summary of Stay    Date/Time Discharge Planning CSW  08/17/23 [phone] Home with husband who will take FMLA-forms  with [place]. Will need recommendations regarding follow up and DME. Doing well and progressing quickly RGD       Maridel Pixler, Lemar Livings

## 2023-08-19 NOTE — Progress Notes (Signed)
PROGRESS NOTE   Subjective/Complaints:  Pt reports no back pain overnight with  muscle relaxant and tylenol. Didn't use lidoderm ROS:   Pt denies SOB, abd pain, CP, N/V/C/D, and vision changes     Objective:   No results found. No results for input(s): "WBC", "HGB", "HCT", "PLT" in the last 72 hours.  No results for input(s): "NA", "K", "CL", "CO2", "GLUCOSE", "BUN", "CREATININE", "CALCIUM" in the last 72 hours.   Intake/Output Summary (Last 24 hours) at 08/19/2023 0807 Last data filed at 08/18/2023 1813 Gross per 24 hour  Intake 716 ml  Output --  Net 716 ml        Physical Exam: Vital Signs Blood pressure 111/60, pulse 86, temperature 98.2 F (36.8 C), temperature source Oral, resp. rate 16, height 5\' 6"  (1.676 m), weight 71.2 kg, last menstrual period 10/19/2008, SpO2 97%.       General: awake, alert, appropriate, sitting up on bedside chair; NAD HENT: conjugate gaze; oropharynx moist CV: regular rate and rhythm; no JVD Pulmonary: CTA B/L; no W/R/R- good air movement GI: soft, NT, ND, (+)BS Psychiatric: appropriate- ready for d/c.  Neurological: Ox3  Musculoskeletal:     Cervical back: Neck supple. No tenderness.    MSK- 5-/5 throughout- walking with AD.  Skin:    General: Skin is warm and dry.     Comments: Scattered erythematous patches on back Has R wrist iv- looks OK  Neurological:     Mental Status: She is alert and oriented to person, place, and time.     Comments: Intact to light touch in all 4 extremities, stable 10/27 Ox3 Absent DTR  Assessment/Plan: 1. Functional deficits which require 3+ hours per day of interdisciplinary therapy in a comprehensive inpatient rehab setting. Physiatrist is providing close team supervision and 24 hour management of active medical problems listed below. Physiatrist and rehab team continue to assess barriers to discharge/monitor patient progress toward  functional and medical goals  Care Tool:  Bathing    Body parts bathed by patient: Right arm, Left arm, Chest, Abdomen, Right upper leg, Buttocks, Front perineal area, Left upper leg, Right lower leg, Left lower leg, Face         Bathing assist Assist Level: Independent with assistive device     Upper Body Dressing/Undressing Upper body dressing   What is the patient wearing?: Pull over shirt    Upper body assist Assist Level: Independent    Lower Body Dressing/Undressing Lower body dressing      What is the patient wearing?: Underwear/pull up, Pants     Lower body assist Assist for lower body dressing: Independent     Toileting Toileting    Toileting assist Assist for toileting: Independent     Transfers Chair/bed transfer  Transfers assist     Chair/bed transfer assist level: Minimal Assistance - Patient > 75%     Locomotion Ambulation   Ambulation assist      Assist level: Supervision/Verbal cueing Assistive device: Rollator (and w/o AD) Max distance: >200'   Walk 10 feet activity   Assist     Assist level: Supervision/Verbal cueing Assistive device: Rollator (and w/o AD)   Walk 50 feet  activity   Assist Walk 50 feet with 2 turns activity did not occur: Safety/medical concerns  Assist level: Supervision/Verbal cueing Assistive device: Rollator, Other (comment) (and w/o AD)    Walk 150 feet activity   Assist Walk 150 feet activity did not occur: Safety/medical concerns  Assist level: Supervision/Verbal cueing Assistive device: Rollator, Other (comment) (and w/o AD)    Walk 10 feet on uneven surface  activity   Assist Walk 10 feet on uneven surfaces activity did not occur: Safety/medical concerns   Assist level: Supervision/Verbal cueing Assistive device: Rollator, Other (comment) (and w/o AD)   Wheelchair     Assist Is the patient using a wheelchair?: Yes Type of Wheelchair: Manual    Wheelchair assist level:  Supervision/Verbal cueing Max wheelchair distance: 150 ft    Wheelchair 50 feet with 2 turns activity    Assist        Assist Level: Supervision/Verbal cueing   Wheelchair 150 feet activity     Assist      Assist Level: Supervision/Verbal cueing   Blood pressure 111/60, pulse 86, temperature 98.2 F (36.8 C), temperature source Oral, resp. rate 16, height 5\' 6"  (1.676 m), weight 71.2 kg, last menstrual period 10/19/2008, SpO2 97%.   Medical Problem List and Plan: 1. 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  2.  Antithrombotics: -DVT/anticoagulation:  Pharmaceutical: Lovenox             -antiplatelet therapy: Aspirin 81 mg   3. Pain Management: continue Tylenol, Robaxin, oxycodone as needed   10/28- said only taking Robaxin and tylenol- no sign on MAR that's taken Oxy since came to rehab  10/29- doing OK with meds- will give lidoderm  2 patches for low back fo rnow  10/30- helped somewhat with lidoderm- con't patches  10/31- went better with just tylenol and muscle relaxants- last night- does want Muscle relaxants and lidoderm to go home with.  4. Mood/Behavior/Sleep: LCSW to evaluate and provide emotional support             -continue Paxil              -antipsychotic agents: n/a   5. Neuropsych/cognition: This patient is capable of making decisions on her own behalf.   6. Skin/Wound Care: Routine skin care checks             -skin rash on back>>Sarna   7. Fluids/Electrolytes/Nutrition: Routine Is and Os and follow-up chemistries             -continue B12 supplementation   8: Hypertension: monitor TID and prn             -amlodipine decreased to 5mg  for hypotension             -continue losartan 50 mg daily  10: Hypokalemia: received 40 mEq today -recheck BMP in am    10/25- K_ up to 4.4 con't to monitor  11: Elevated BUN: encourage oral hydration and follow-up BMP in am- was drinking Coke - switched to water   10/25- BMP shows mild improvement in BUN- will con't to push fluids   10/30- says is drinking- BUN down to 14 12. Hypotension: amlodipine decreased to 5mg ,  resolved, continue this dose.    1-28- BP well controlled- running 110s to 130s with reduction of Norvasc.   The patient is medically ready for discharge to home and will NOT need follow-up with [facility] PM[facility]R- give her our number, just in case. In addition, they will need to follow up with their PCP, Neurology.     LOS: 7 days A FACE TO FACE EVALUATION WAS PERFORMED  Captain Blucher 08/19/2023, 8:07 AM

## 2023-08-19 NOTE — Progress Notes (Signed)
INPATIENT REHABILITATION DISCHARGE NOTE   Discharge instructions by: Fermin Schwab, PA  Verbalized understanding: Yes  Skin care/Wound care healing? N/A  Pain: none  IV's: none   Tubes/Drains: none  O2: none  Safety instructions: reviewed with pt and family  Patient belongings: sent with pt  Discharged to: home  Discharged via: family transport  Notes: done  Marylu Lund, Charity fundraiser

## 2023-08-19 NOTE — Progress Notes (Signed)
Inpatient Rehabilitation Discharge Medication Review by a Pharmacist  A complete drug regimen review was completed for this patient to identify any potential clinically significant medication issues.  High Risk Drug Classes Is patient taking? Indication by Medication  Antipsychotic No   Anticoagulant No   Antibiotic No   Opioid No   Antiplatelet Yes Aspirin-CAD  Hypoglycemics/insulin No   Vasoactive Medication Yes Losartan, amlodipine-HTN   Chemotherapy No   Other Yes Famotidine-GERD Methocarbamol-spasms Paxil-Mood Cyanocobalamine-Supplementation Sarna - itching Lidocaine patch - pain     Type of Medication Issue Identified Description of Issue Recommendation(s)  Drug Interaction(s) (clinically significant)     Duplicate Therapy     Allergy     No Medication Administration End Date     Incorrect Dose     Additional Drug Therapy Needed     Significant med changes from prior encounter (inform family/care partners about these prior to discharge).    Other       Clinically significant medication issues were identified that warrant physician communication and completion of prescribed/recommended actions by midnight of the next day:  No  Name of provider notified for urgent issues identified:   Provider Method of Notification:     Pharmacist comments:   Time spent performing this drug regimen review (minutes):  20  Ulyses Southward, PharmD, [place], AAHIVP, CPP Infectious Disease Pharmacist 08/19/2023 7:32 AM

## 2023-08-24 ENCOUNTER — Telehealth: Payer: Self-pay

## 2023-08-24 NOTE — Telephone Encounter (Signed)
Patient Husband called in stating that he needs his continuous leave changed to intermittent leave, he says he just needs a note stating this change and if so he can come pick it up.

## 2023-08-25 NOTE — Telephone Encounter (Signed)
Called patient back, he said he may not need the intermittent leave now, he is waiting on his HR people to give him a call back and he will update [place] to let us know, wife appointment w/ neurolagy is 09/29/23, I also advised if it is an immediate need to contact Brailee's PCP. He will call us when he knows for sure.

## 2023-08-25 NOTE — Telephone Encounter (Signed)
Patient is asking for a letter stating that he needs intermittent leave, rather than continuous leave, is this something you can write for patient since Dr Berline Chough is out til next week? Patient states his employer needs it as soon as possible and cannot get in with neuro or PCP in enough time to request letter.

## 2023-08-26 NOTE — Telephone Encounter (Signed)
Called patient to let him know that there is no other provider here that will be able to do the letter.  He advised he will wait until Dr Berline Chough gets back.

## 2023-08-27 LAB — MISC LABCORP TEST (SEND OUT): Labcorp test code: 9985

## 2023-08-31 LAB — MISC LABCORP TEST (SEND OUT)
Labcorp test code: 165620
Source (LabCorp): 2

## 2023-09-01 ENCOUNTER — Telehealth: Payer: Self-pay

## 2023-09-01 NOTE — Telephone Encounter (Signed)
If I'm not scheduled to see pt- which I'm not I don't think, needs to get from Neurology- -ML

## 2023-09-01 NOTE — Therapy (Unsigned)
OUTPATIENT OCCUPATIONAL THERAPY NEURO EVALUATION  Patient Name: Jill Allison MRN: [id number] DOB:Mar 28, 1961, 62 y.o., female Today's Date: 09/02/2023  PCP: Mila Palmer, MD  REFERRING PROVIDER: Genice Rouge, MD   END OF SESSION:  OT End of Session - 09/02/23 1520     Visit Number 1    Number of Visits 1    OT Start Time 1448    OT Stop Time 1508    OT Time Calculation (min) 20 min    Activity Tolerance Patient tolerated treatment well    Behavior During Therapy [facility] for tasks assessed/performed             No past medical history on file. No past surgical history on file. Patient Active Problem List   Diagnosis Date Noted   GBS (Guillain-Barre syndrome) (HCC) 08/12/2023   Ataxia 08/06/2023   Paresthesias 08/06/2023   Known medical problems 08/06/2023    ONSET DATE: 08/18/23 (referral date)  REFERRING DIAG: G61.0 (ICD-10-CM) - Guillain-Barre syndrome   THERAPY DIAG:  Muscle weakness (generalized)  Other symptoms and signs involving the nervous system  Rationale for Evaluation and Treatment: Rehabilitation  SUBJECTIVE:   SUBJECTIVE STATEMENT: Pt reports no longer having trouble at all since discharged from hospital 2 weeks ago. Pt no longer using rollator walker or shower chair. Pt reports everything has been normal. Pt reports still continuing to work towards getting strength and endurance back. Pt reports no difficulty with climbing stairs.  Pt reported no longer taking Dulcolax, SARNA lotion, lidocaine, amLODipine (Norvasc). OT updated medication list.  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 female who presented to department on 08/06/2023 complaining of increasing pain and weakness in the extremities bilaterally.  She reported a history of COVID infection  approximately 2 weeks prior to admission.  She was recently treated for upper respiratory infection and placed on antibiotics.  On presentation, she described pain in both feet, numbness and tingling in both hands... 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 11)  WEIGHT BEARING RESTRICTIONS: No  PAIN:  Are you having pain? No  FALLS: Has patient fallen in last 6 months? No  LIVING ENVIRONMENT: Lives with: lives with their spouse Lives in: House/apartment Stairs: Yes: External: 7 steps; can reach both, ramped back entry - pt reports no difficulty with stairs Has following equipment at home: Dan Humphreys - 4 wheeled, shower chair, Grab bars, and Ramped entry  PLOF: Independent, Working part-time: Sales promotion account executive, Driving   Pt reports not currently driving (must be cleared by neurologist, appointment scheduled in December).  Per 08/18/23 Inpatient Rehab OT Progress Note D/C Summary: "Patient has met 11 of 11 long term goals due to improved activity tolerance, improved balance, postural control, ability to compensate for deficits, and improved coordination.  Pt made excellent progress with BADLs and IADLs during this admission. Mod I for bathing/dressing and functional transfers. Supervision/mod I for IADLs. Pt's family has not been present for educaiton. Patient to discharge at overall Modified Independent level.  Patient's care partner is independent to provide the necessary physical assistance at discharge."   PATIENT GOALS: No specified goals. Per pt: "I'm pretty much good. There's nothing I can't do now that I was doing before."   OBJECTIVE:  Note: Objective measures were completed at  Evaluation unless otherwise noted.  HAND DOMINANCE: Right  ADLs: Overall ADLs: ind Transfers/ambulation related to ADLs: Eating: ind Grooming:  ind UB Dressing: ind, no difficulty with zippers/buttons LB Dressing: ind, no difficulty with tying shoes Toileting: ind Bathing: ind, no longer using shower chair Tub Shower transfers: ind with grab bars Equipment: Shower seat without back, Grab bars, and Walk in shower  IADLs: Shopping: ind, denies fatigue Light housekeeping: ind Meal Prep: ind with all Community mobility: Not yet returned d/t not yet cleared by neurologist (appointment scheduled for December). Pt's husband dropped off pt today. Medication management: ind Financial management: ind Handwriting: 100% legible and "It came back."  Pt reports putting up Christmas decorations.  Work: Pt reports she feels she could complete all work tasks though has not returned to work because she has not yet been cleared to drive by neurologist, (upcoming appointment scheduled for December).  MOBILITY STATUS: Independent with ambulation without A/E  POSTURE COMMENTS:  Ind seated posture  ACTIVITY TOLERANCE: Activity tolerance: Pt reports very mild fatigue during the day but "pretty much back to where I was."  FUNCTIONAL OUTCOME MEASURES: PSFS - 10 (no deficit)    UPPER EXTREMITY ROM:    Active ROM Right eval Left eval  Shoulder flexion [facility]  Shoulder abduction    Shoulder adduction    Shoulder extension    Shoulder internal rotation    Shoulder external rotation    Elbow flexion    Elbow extension    Wrist flexion    Wrist extension    Wrist ulnar deviation    Wrist radial deviation    Wrist pronation    Wrist supination    (Blank rows = not tested)  UPPER EXTREMITY MMT:     MMT Right eval Left eval  Shoulder flexion 5 5  Shoulder abduction    Shoulder adduction    Shoulder extension    Shoulder internal rotation    Shoulder external rotation    Middle trapezius    Lower trapezius    Elbow flexion    Elbow extension    Wrist flexion    Wrist extension    Wrist ulnar deviation    Wrist radial  deviation    Wrist pronation    Wrist supination    (Blank rows = not tested)  HAND FUNCTION: Grip strength: Right: 58.8 lbs; Left: 53.3 lbs  COORDINATION: 9 Hole Peg test: Right: 19 sec; Left: 20 sec  SENSATION: Pt reports sensation of BUE feels "normal," which greatly improved from "pins and needles" sensation of B hands and feet 2 weeks ago.  EDEMA: None noted  MUSCLE TONE: RUE: Within functional limits and LUE: Within functional limits  COGNITION: Overall cognitive status: Within functional limits for tasks assessed  VISION: Subjective report: Pt reports no changes to vision.  PERCEPTION: Not tested  PRAXIS: Not tested  OBSERVATIONS:  Pt ambulated ind without A/E. Pt was pleasant and appeared well-kept.   TODAY'S TREATMENT:  DATE:   Eval only  PATIENT EDUCATION: Education details: OT role, OT eval only therefore no follow-up appointments, continuing to participate in daily tasks, continuing to build activity tolerance, driving considerations not determined by OT at this clinic and recommended to follow up with neurologist Person educated: Patient Education method: Explanation Education comprehension: verbalized understanding  HOME EXERCISE PROGRAM: N/A - eval only   GOALS: Goals reviewed with patient? No - N/A, eval only  SHORT TERM GOALS: N/A, eval only  LONG TERM GOALS: N/A, eval only  ASSESSMENT:  CLINICAL IMPRESSION: Patient is a 62 y.o. female who was seen today for occupational therapy evaluation for Guillain-Barre syndrome. 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 Patient currently presents at baseline level of functioning demonstrating great functional use of BUE, intact BUE strength, intact coordination, and no concerns about participation  in daily tasks. D/t pt participating ind in ADL/IADL tasks and demo'ing intact BUE function, OT eval only today.   PERFORMANCE DEFICITS: in functional skills including none, cognitive skills including none, and psychosocial skills including none.   IMPAIRMENTS: are limiting patient from  driving and work because pt is waiting for appointment with neurologist to be evaluated for driving considerations (Neurology appointment scheduled in December) .   CO-MORBIDITIES: has no other co-morbidities that affects occupational performance. Patient will benefit from skilled OT to address above impairments and improve overall function.  MODIFICATION OR ASSISTANCE TO COMPLETE EVALUATION: No modification of tasks or assist necessary to complete an evaluation.  OT OCCUPATIONAL PROFILE AND HISTORY: Problem focused assessment: Including review of records relating to presenting problem.  CLINICAL DECISION MAKING: LOW - limited treatment options, no task modification necessary  REHAB POTENTIAL: Excellent  EVALUATION COMPLEXITY: Low    PLAN:  OT FREQUENCY: N/A, eval only  OT DURATION: N/A, eval only  PLANNED INTERVENTIONS: N/A, eval only  RECOMMENDED OTHER SERVICES: PT eval also completed today  CONSULTED AND AGREED WITH PLAN OF CARE: Patient  PLAN FOR NEXT SESSION:  N/A, eval only   Wynetta Emery, OT 09/02/2023, 3:28 PM

## 2023-09-01 NOTE — Telephone Encounter (Signed)
Trey Paula, husband of patient called stating that he needs a letter to turn in for [facility]. The letter needs to state that husband Jill Allison is changing from continuous care to intermittent care for patient. He will print out from her MyChart.

## 2023-09-02 ENCOUNTER — Other Ambulatory Visit: Payer: Self-pay

## 2023-09-02 ENCOUNTER — Ambulatory Visit: Payer: 59 | Attending: Family Medicine

## 2023-09-02 ENCOUNTER — Ambulatory Visit: Payer: 59 | Admitting: Occupational Therapy

## 2023-09-02 DIAGNOSIS — M6281 Muscle weakness (generalized): Secondary | ICD-10-CM | POA: Insufficient documentation

## 2023-09-02 DIAGNOSIS — R29818 Other symptoms and signs involving the nervous system: Secondary | ICD-10-CM

## 2023-09-02 DIAGNOSIS — R2689 Other abnormalities of gait and mobility: Secondary | ICD-10-CM | POA: Diagnosis present

## 2023-09-02 NOTE — Therapy (Signed)
Patient presented to physical therapy evaluation following hospitalization for Guillain Barre Syndrome. She stated she had COVID in October and two weeks later was diagnosed with GBS. She was discharged from the hospital two weeks ago and has been improving every since. She did not bring an AD with her, she keeps a rollator in the car but has not needed it. She is not driving yet but other than that reported she has "been doing what I was doing before". All N/T in hands and feet has resolved. She sees her Neurologist on 09/29/23 with hopes of getting cleared to drive. Patient was very pleasant but verbalized she does not feel she needs PT as she is back to her PLOF before hospitalization. No skilled PT goals at this time.   Bradd Burner Gloria Lambertson, SPT

## 2023-09-02 NOTE — Telephone Encounter (Signed)
Patient husband states he believes that Wendi Maya was the one that originally did his FMLA paperwork. He just needs a letter changing his FMLA from continuous to intermittent.

## 2023-09-07 ENCOUNTER — Other Ambulatory Visit: Payer: Self-pay | Admitting: Obstetrics and Gynecology

## 2023-09-07 DIAGNOSIS — E2839 Other primary ovarian failure: Secondary | ICD-10-CM

## 2023-09-29 ENCOUNTER — Encounter: Payer: Self-pay | Admitting: Neurology

## 2023-09-29 ENCOUNTER — Ambulatory Visit: Payer: 59 | Admitting: Neurology

## 2023-09-29 VITALS — BP 131/88 | HR 84 | Ht 66.0 in | Wt 161.5 lb

## 2023-09-29 DIAGNOSIS — G610 Guillain-Barre syndrome: Secondary | ICD-10-CM | POA: Diagnosis not present

## 2023-09-29 NOTE — Progress Notes (Signed)
Chief Complaint  Patient presents with   Hospitalization Follow-up    Rm15, husband present, internal hospital referral for GBS/AIDP possible NCV/EMG: pt stated had miller filler fisher 6 weeks ago immobilized, but is back to normal now       ASSESSMENT AND PLAN  Jill Allison is a 62 y.o. female   Guillain-Barr syndrome following COVID infection upper respiratory symptoms in October 2024,  Symptoms prior to within 1 week, treatment of IVIG within 7 days of symptom onset, with quick improvement of her symptoms,  Now she is back to her baseline, well-preserved sensory examination deep tendon reflex,  Only return to clinic for new issues  DIAGNOSTIC DATA (LABS, IMAGING, TESTING) - I reviewed patient records, labs, notes, testing and imaging myself where available.   MEDICAL HISTORY:  Jill Allison is a 62 year old female, seen in request by her primary care physician from [facility] Dr. Mila Palmer, to follow-up on Guillain-Barr syndrome hospital admission, initial evaluation was on September 29, 2023  History is obtained from the patient and review of electronic medical records. I personally reviewed pertinent available imaging films in PACS.   PMHx of  HTN Depression, anxiety  She suffered COVID infection beginning of October 2024, this is her third infection, felt different from previous 2, whole body feel drained, only mild upper respiratory symptoms, which gradually began to recover by next week  Around August 02, 2023, she had subacute onset bilateral lower extremity deep achy discomfort, by October 15, she felt numbness tingling of bilateral feet,  October 17, felt fingertips numbness tingling burning pain  We On October 18 noticed gait abnormality, but less neuropathic pain, was sent to emergency room, by then, she could barely bear her weight, also developed upper extremity weakness, at her worst, she also complains of ptosis, denies dysarthria, no double vision,  areflexia at hospital examination,  Spine tab on August 06 2023, spinal fluid testing TP was 50, RBC of 230 WBC of 2,  Started IVIG treatment on Oct 19 IVIG x 5 days, then began to recover, significant difference by day 4 of treatment October 22, her symptoms gradually improved, generalized weakness last for couple weeks  She is not back to her baseline, no weakness, no sensory loss, well-preserved sensory, and deep tendon reflex at today's exam   PHYSICAL EXAM:   Vitals:   09/29/23 1531  BP: 131/88  Pulse: 84  Weight: 161 lb 8 oz (73.3 kg)  Height: 5\' 6"  (1.676 m)      Body mass index is 26.07 kg/m.  PHYSICAL EXAMNIATION:  Gen: NAD, conversant, well nourised, well groomed                     Cardiovascular: Regular rate rhythm, no peripheral edema, warm, nontender. Eyes: Conjunctivae clear without exudates or hemorrhage Neck: Supple, no carotid bruits. Pulmonary: Clear to auscultation bilaterally   NEUROLOGICAL EXAM:  MENTAL STATUS: Speech/cognition: Awake, alert, oriented to history taking and casual conversation CRANIAL NERVES: CN II: Visual fields are full to confrontation. Pupils are round equal and briskly reactive to light. CN III, IV, VI: extraocular movement are normal. No ptosis. CN V: Facial sensation is intact to light touch CN VII: Face is symmetric with normal eye closure  CN VIII: Hearing is normal to causal conversation. CN IX, X: Phonation is normal. CN XI: Head turning and shoulder shrug are intact  MOTOR: There is no pronator drift of out-stretched arms. Muscle bulk and tone are normal. Muscle  strength is normal.  REFLEXES: Reflexes are 2+ and symmetric at the biceps, triceps, knees, and ankles. Plantar responses are flexor.  SENSORY: Intact to light touch, pinprick and vibratory sensation are intact in fingers and toes.  COORDINATION: There is no trunk or limb dysmetria noted.  GAIT/STANCE: Posture is normal. Gait is steady with normal  steps, base, arm swing, and turning. Heel and toe walking are normal. Tandem gait is normal.  Romberg is absent.  REVIEW OF SYSTEMS:  Full 14 system review of systems performed and notable only for as above All other review of systems were negative.   ALLERGIES: No Known Allergies  HOME MEDICATIONS: Current Outpatient Medications  Medication Sig Dispense Refill   acetaminophen (TYLENOL) 325 MG tablet Take 1-2 tablets (325-650 mg total) by mouth every 4 (four) hours as needed for mild pain (pain score 1-3).     aspirin EC 81 MG tablet Take 81 mg by mouth daily.     cyanocobalamin 1000 MCG tablet Take 1 tablet (1,000 mcg total) by mouth daily. 30 tablet 0   docusate sodium (COLACE) 100 MG capsule Take 1 capsule (100 mg total) by mouth at bedtime.     famotidine (PEPCID) 20 MG tablet Take 20 mg by mouth daily.     losartan (COZAAR) 50 MG tablet Take 1 tablet (50 mg total) by mouth daily. 30 tablet 0   PARoxetine (PAXIL) 20 MG tablet Take 20 mg by mouth daily.     No current facility-administered medications for this visit.    PAST MEDICAL HISTORY: History reviewed. No pertinent past medical history.  PAST SURGICAL HISTORY: History reviewed. No pertinent surgical history.  FAMILY HISTORY: History reviewed. No pertinent family history.  SOCIAL HISTORY: Social History   Socioeconomic History   Marital status: Married    Spouse name: Not on file   Number of children: Not on file   Years of education: Not on file   Highest education level: Not on file  Occupational History   Not on file  Tobacco Use   Smoking status: Never   Smokeless tobacco: Not on file  Substance and Sexual Activity   Alcohol use: No   Drug use: No   Sexual activity: Not on file  Other Topics Concern   Not on file  Social History Narrative   Not on file   Social Determinants of Health   Financial Resource Strain: Not on file  Food Insecurity: No Food Insecurity (08/06/2023)   Hunger Vital Sign     Worried About Running Out of Food in the Last Year: Never true    Ran Out of Food in the Last Year: Never true  Transportation Needs: No Transportation Needs (08/06/2023)   PRAPARE - Administrator, Civil Service (Medical): No    Lack of Transportation (Non-Medical): No  Physical Activity: Not on file  Stress: Not on file  Social Connections: Not on file  Intimate Partner Violence: Not At Risk (08/06/2023)   Humiliation, Afraid, Rape, and Kick questionnaire    Fear of Current or Ex-Partner: No    Emotionally Abused: No    Physically Abused: No    Sexually Abused: No      Levert Feinstein, M.D. Ph.D.  [facility] [address] [place], [place] [address] Ph: [phone] Fax: [phone]  CC:  Burnadette Pop, MD [address] [place],  [place] [address]  Mila Palmer, MD

## 2024-09-11 ENCOUNTER — Encounter (HOSPITAL_COMMUNITY): Payer: Self-pay | Admitting: Obstetrics and Gynecology
